# Patient Record
Sex: Female | Born: 1970 | Race: Black or African American | Hispanic: No | Marital: Married | Smoking: Never smoker
Health system: Southern US, Community
[De-identification: ages and names within clinical notes are randomized; demographics above are authoritative.]

## PROBLEM LIST (undated history)

## (undated) DIAGNOSIS — I10 Essential (primary) hypertension: Secondary | ICD-10-CM

## (undated) DIAGNOSIS — J45909 Unspecified asthma, uncomplicated: Secondary | ICD-10-CM

## (undated) DIAGNOSIS — R569 Unspecified convulsions: Secondary | ICD-10-CM

## (undated) HISTORY — DX: Essential (primary) hypertension: I10

## (undated) HISTORY — DX: Unspecified asthma, uncomplicated: J45.909

## (undated) HISTORY — PX: EYE SURGERY: SHX253

## (undated) HISTORY — DX: Unspecified convulsions: R56.9

## (undated) HISTORY — PX: OTHER SURGICAL HISTORY: SHX169

## (undated) HISTORY — PX: TUBAL LIGATION: SHX77

## (undated) HISTORY — PX: CHOLECYSTECTOMY: SHX55

---
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
## 1998-02-02 ENCOUNTER — Other Ambulatory Visit: Admission: RE | Admit: 1998-02-02 | Discharge: 1998-02-02 | Payer: Self-pay | Admitting: Obstetrics and Gynecology

## 1999-03-04 ENCOUNTER — Other Ambulatory Visit: Admission: RE | Admit: 1999-03-04 | Discharge: 1999-03-04 | Payer: Self-pay | Admitting: Obstetrics and Gynecology

## 2000-03-01 ENCOUNTER — Other Ambulatory Visit: Admission: RE | Admit: 2000-03-01 | Discharge: 2000-03-01 | Payer: Self-pay | Admitting: Obstetrics and Gynecology

## 2000-04-17 ENCOUNTER — Encounter (INDEPENDENT_AMBULATORY_CARE_PROVIDER_SITE_OTHER): Payer: Self-pay | Admitting: Specialist

## 2000-04-17 ENCOUNTER — Other Ambulatory Visit: Admission: RE | Admit: 2000-04-17 | Discharge: 2000-04-17 | Payer: Self-pay | Admitting: Obstetrics and Gynecology

## 2001-04-25 ENCOUNTER — Other Ambulatory Visit: Admission: RE | Admit: 2001-04-25 | Discharge: 2001-04-25 | Payer: Self-pay | Admitting: Obstetrics and Gynecology

## 2002-03-13 ENCOUNTER — Emergency Department (HOSPITAL_COMMUNITY): Admission: EM | Admit: 2002-03-13 | Discharge: 2002-03-14 | Payer: Self-pay | Admitting: Emergency Medicine

## 2002-06-29 ENCOUNTER — Inpatient Hospital Stay (HOSPITAL_COMMUNITY): Admission: AD | Admit: 2002-06-29 | Discharge: 2002-07-10 | Payer: Self-pay | Admitting: Obstetrics and Gynecology

## 2002-06-30 ENCOUNTER — Encounter: Payer: Self-pay | Admitting: Obstetrics and Gynecology

## 2002-07-05 ENCOUNTER — Encounter: Payer: Self-pay | Admitting: Obstetrics and Gynecology

## 2002-07-09 ENCOUNTER — Encounter (INDEPENDENT_AMBULATORY_CARE_PROVIDER_SITE_OTHER): Payer: Self-pay | Admitting: Specialist

## 2002-09-05 ENCOUNTER — Emergency Department (HOSPITAL_COMMUNITY): Admission: AD | Admit: 2002-09-05 | Discharge: 2002-09-05 | Payer: Self-pay | Admitting: Emergency Medicine

## 2002-11-28 ENCOUNTER — Other Ambulatory Visit: Admission: RE | Admit: 2002-11-28 | Discharge: 2002-11-28 | Payer: Self-pay | Admitting: Obstetrics and Gynecology

## 2002-11-29 ENCOUNTER — Other Ambulatory Visit: Admission: RE | Admit: 2002-11-29 | Discharge: 2002-11-29 | Payer: Self-pay | Admitting: Obstetrics and Gynecology

## 2002-12-02 ENCOUNTER — Encounter: Payer: Self-pay | Admitting: Obstetrics and Gynecology

## 2002-12-02 ENCOUNTER — Ambulatory Visit (HOSPITAL_COMMUNITY): Admission: RE | Admit: 2002-12-02 | Discharge: 2002-12-02 | Payer: Self-pay | Admitting: Obstetrics and Gynecology

## 2003-02-03 ENCOUNTER — Observation Stay (HOSPITAL_COMMUNITY): Admission: RE | Admit: 2003-02-03 | Discharge: 2003-02-04 | Payer: Self-pay | Admitting: Obstetrics and Gynecology

## 2003-03-05 ENCOUNTER — Ambulatory Visit (HOSPITAL_COMMUNITY): Admission: RE | Admit: 2003-03-05 | Discharge: 2003-03-05 | Payer: Self-pay | Admitting: Obstetrics and Gynecology

## 2003-03-25 ENCOUNTER — Inpatient Hospital Stay (HOSPITAL_COMMUNITY): Admission: AD | Admit: 2003-03-25 | Discharge: 2003-03-28 | Payer: Self-pay | Admitting: Obstetrics and Gynecology

## 2003-03-25 ENCOUNTER — Encounter (INDEPENDENT_AMBULATORY_CARE_PROVIDER_SITE_OTHER): Payer: Self-pay

## 2003-04-05 DIAGNOSIS — I10 Essential (primary) hypertension: Secondary | ICD-10-CM

## 2003-04-05 HISTORY — DX: Essential (primary) hypertension: I10

## 2003-09-14 ENCOUNTER — Emergency Department (HOSPITAL_COMMUNITY): Admission: AC | Admit: 2003-09-14 | Discharge: 2003-09-14 | Payer: Self-pay | Admitting: Emergency Medicine

## 2003-09-23 ENCOUNTER — Ambulatory Visit (HOSPITAL_COMMUNITY): Admission: RE | Admit: 2003-09-23 | Discharge: 2003-09-23 | Payer: Self-pay | Admitting: General Surgery

## 2004-01-20 ENCOUNTER — Ambulatory Visit: Payer: Self-pay | Admitting: *Deleted

## 2004-01-26 ENCOUNTER — Inpatient Hospital Stay (HOSPITAL_COMMUNITY): Admission: AD | Admit: 2004-01-26 | Discharge: 2004-01-28 | Payer: Self-pay | Admitting: *Deleted

## 2004-02-04 ENCOUNTER — Ambulatory Visit: Payer: Self-pay | Admitting: *Deleted

## 2004-02-11 ENCOUNTER — Ambulatory Visit: Payer: Self-pay | Admitting: *Deleted

## 2004-02-18 ENCOUNTER — Ambulatory Visit: Payer: Self-pay | Admitting: Obstetrics & Gynecology

## 2004-02-20 ENCOUNTER — Ambulatory Visit (HOSPITAL_COMMUNITY): Admission: RE | Admit: 2004-02-20 | Discharge: 2004-02-20 | Payer: Self-pay | Admitting: *Deleted

## 2004-03-03 ENCOUNTER — Ambulatory Visit: Payer: Self-pay | Admitting: *Deleted

## 2004-03-17 ENCOUNTER — Ambulatory Visit (HOSPITAL_COMMUNITY): Admission: RE | Admit: 2004-03-17 | Discharge: 2004-03-17 | Payer: Self-pay | Admitting: *Deleted

## 2004-03-17 ENCOUNTER — Ambulatory Visit: Payer: Self-pay | Admitting: *Deleted

## 2004-03-24 ENCOUNTER — Ambulatory Visit: Payer: Self-pay | Admitting: *Deleted

## 2004-04-07 ENCOUNTER — Ambulatory Visit: Payer: Self-pay | Admitting: *Deleted

## 2004-04-09 ENCOUNTER — Inpatient Hospital Stay (HOSPITAL_COMMUNITY): Admission: AD | Admit: 2004-04-09 | Discharge: 2004-04-09 | Payer: Self-pay | Admitting: *Deleted

## 2004-04-14 ENCOUNTER — Ambulatory Visit: Payer: Self-pay | Admitting: *Deleted

## 2004-04-14 ENCOUNTER — Ambulatory Visit: Payer: Self-pay | Admitting: Obstetrics and Gynecology

## 2004-04-14 ENCOUNTER — Inpatient Hospital Stay (HOSPITAL_COMMUNITY): Admission: AD | Admit: 2004-04-14 | Discharge: 2004-04-18 | Payer: Self-pay | Admitting: Obstetrics and Gynecology

## 2004-04-21 ENCOUNTER — Inpatient Hospital Stay (HOSPITAL_COMMUNITY): Admission: AD | Admit: 2004-04-21 | Discharge: 2004-04-30 | Payer: Self-pay | Admitting: *Deleted

## 2004-04-21 ENCOUNTER — Ambulatory Visit: Payer: Self-pay | Admitting: *Deleted

## 2004-04-21 ENCOUNTER — Ambulatory Visit: Payer: Self-pay | Admitting: Obstetrics & Gynecology

## 2004-04-29 ENCOUNTER — Encounter (INDEPENDENT_AMBULATORY_CARE_PROVIDER_SITE_OTHER): Payer: Self-pay | Admitting: Specialist

## 2004-05-12 ENCOUNTER — Emergency Department (HOSPITAL_COMMUNITY): Admission: EM | Admit: 2004-05-12 | Discharge: 2004-05-12 | Payer: Self-pay | Admitting: Family Medicine

## 2004-06-17 ENCOUNTER — Ambulatory Visit: Payer: Self-pay | Admitting: Obstetrics and Gynecology

## 2004-07-03 ENCOUNTER — Encounter (INDEPENDENT_AMBULATORY_CARE_PROVIDER_SITE_OTHER): Payer: Self-pay | Admitting: *Deleted

## 2004-07-03 LAB — CONVERTED CEMR LAB

## 2004-07-12 ENCOUNTER — Ambulatory Visit: Payer: Self-pay | Admitting: Family Medicine

## 2004-07-12 ENCOUNTER — Ambulatory Visit (HOSPITAL_COMMUNITY): Admission: RE | Admit: 2004-07-12 | Discharge: 2004-07-12 | Payer: Self-pay | Admitting: Family Medicine

## 2004-07-27 ENCOUNTER — Ambulatory Visit: Payer: Self-pay | Admitting: Obstetrics & Gynecology

## 2004-08-12 ENCOUNTER — Ambulatory Visit: Payer: Self-pay | Admitting: Family Medicine

## 2004-10-01 ENCOUNTER — Ambulatory Visit: Payer: Self-pay | Admitting: Family Medicine

## 2004-11-12 ENCOUNTER — Emergency Department (HOSPITAL_COMMUNITY): Admission: EM | Admit: 2004-11-12 | Discharge: 2004-11-12 | Payer: Self-pay | Admitting: Family Medicine

## 2006-06-01 DIAGNOSIS — M545 Low back pain: Secondary | ICD-10-CM

## 2006-06-02 ENCOUNTER — Encounter (INDEPENDENT_AMBULATORY_CARE_PROVIDER_SITE_OTHER): Payer: Self-pay | Admitting: *Deleted

## 2006-09-22 ENCOUNTER — Ambulatory Visit (HOSPITAL_BASED_OUTPATIENT_CLINIC_OR_DEPARTMENT_OTHER): Admission: RE | Admit: 2006-09-22 | Discharge: 2006-09-22 | Payer: Self-pay | Admitting: Ophthalmology

## 2009-11-17 ENCOUNTER — Emergency Department (HOSPITAL_COMMUNITY): Admission: EM | Admit: 2009-11-17 | Discharge: 2009-11-17 | Payer: Self-pay | Admitting: Family Medicine

## 2010-08-17 NOTE — Op Note (Signed)
[name], [name]              ACCOUNT NO.:  [id number]   MEDICAL RECORD NO.:  [id number]          PATIENT TYPE:  AMB   LOCATION:  DSC                          FACILITY:  [facility]   PHYSICIAN:  Pasty Spillers. Maple Hudson, M.D. DATE OF BIRTH:  05-13-1970   DATE OF PROCEDURE:  09/22/2006  DATE OF DISCHARGE:                               OPERATIVE REPORT   PREOPERATIVE DIAGNOSES:  1. A pattern exotropia.  2. Status post previous surgery, details unknown.  3. Developmental delay.   POSTOPERATIVE DIAGNOSES:  1. A pattern exotropia.  2. Status post previous surgery, details unknown.  3. Developmental delay.   PROCEDURES:  1. Exploration of left medial and lateral rectus muscles and right      lateral rectus muscle.  2. Left lateral rectus muscle recession, 9.0 mm.  3. Left medial rectus muscle resection, 7.0 mm.  4. Right lateral rectus muscle recession, 9.0 mm (appeared previously      resected).  5. Superior oblique tenotomy, both eyes.   SURGEON:  Pasty Spillers. Maple Hudson, M.D.   ANESTHESIA:  General (laryngeal mask).   COMPLICATIONS:  None.   DESCRIPTION OF PROCEDURE:  After preop evaluation including informed  consent, the patient was taken to the operating room, where she was  identified by me.  General anesthesia was induced without difficulty  after placement of appropriate monitors.  The patient was prepped and  draped in a standard sterile fashion.  A lid speculum placed in each  eye, and exaggerated forced traction testing was carried out, confirming  tightness of both superior oblique tendons.  Through a superonasal nasal  fornix incision through conjunctiva and Tenon's fascia, the left  superior rectus muscle was engaged on a series of muscle hooks.  A  Desmarres retractor was placed through the conjunctival incision and  drawn posteriorly, exposing the superior oblique tendon along the nasal  border of the superior rectus muscle.  The tendon was engaged on an  oblique hook and  severed with Westcott scissors.  Forced traction  testing was repeated and confirmed to be free.  Through an  inferotemporal fornix incision, the left lateral rectus muscle was  engaged on a series of muscle hooks and cleared of its fascial  attachments.  There was no evidence of previous surgery of lateral  rectus muscle.  The tendon was secured with a double-armed 6-0 Vicryl  suture, with a double-locking bite at each border of the muscle, 1 mm  from the insertion.  The muscle was disinserted but not reattached,  pending evaluation of other muscles.  Through the same superonasal  fornix incision used for the superior oblique tenotomy, the right medial  rectus muscle was engaged on a series of muscle hooks and cleared of its  fascial attachments.  Note that the muscle did not appear to be  previously operated.  The muscle was spread between two self-retaining  hooks.  A 2 mm bite was taken of the center of the muscle belly at a  measured distance of 7.0 mm posterior to the original insertion, and a  knot was tied securely at this location.  The needle at each end of the  double-armed suture was passed from the center of the muscle belly to  the periphery, parallel to and 7.0 mm posterior to the insertion.  A  double-locking bite was placed at each border of the muscle.  A  resection clamp was placed on the muscle just anterior to these sutures.  The muscle was disinserted.  Each pole suture was passed posteriorly to  anteriorly through the periphery of the muscle stump, then anteriorly to  posteriorly near the center of the stump, then posteriorly to anteriorly  through the center of muscle belly, just posterior to the previously-  placed knot.  The muscle was drawn up to the level of the original  insertion and all slack was removed before the suture ends were tied  securely.  The clamp was removed.  The portion of the muscle anterior to  the sutures was carefully excised.  Conjunctiva  was closed with two 6-0  Vicryl sutures.  The speculum was removed and transferred to the right  eye.  Conjunctival scarring confirmed that there had been previous  surgery in the vicinity of the right lateral rectus muscle.  Through an  inferotemporal fornix incision through conjunctiva and Tenon's fascia,  the right lateral rectus muscle was engaged on a series of muscle hooks  and cleared of its surrounding fascial attachments and scar tissue.  The  muscle was found inserted approximately 7 mm posterior to limbus, but it  had clearly been operated, so it was concluded that it must have been  resected previously.  The tendon was secured with a double-armed 6-0  Vicryl suture, with a double-locking bite at each border of the muscle,  1 mm from the insertion.  The muscle was disinserted..  The insertion  was not clean, again consistent with a previous resection.  The muscle  was reattached to sclera at a measured distance of 9.0 mm posterior to  the original insertion, using direct scleral passes in crossed-swords  fashion.  The suture ends were tied securely after the position of the  muscle was checked and found to be accurate.  The conjunctiva was closed  with two 6-0 Vicryl sutures.  Through a superonasal nasal fornix  incision through conjunctiva and Tenon's fascia, the right superior  oblique tendon was severed, just as described for the left eye.  Again,  forced traction testing was confirmed to be free after the tenotomy.  After closing conjunctiva, TobraDex ointment was placed in each eye.  The patient was awakened without difficulty and taken to the recovery  room in stable condition, having suffered no intraoperative or immediate  postoperative complications.      Pasty Spillers. Maple Hudson, M.D.  Electronically Signed     WOY/MEDQ  D:  09/22/2006  T:  09/22/2006  Job:  [id number]

## 2010-08-20 NOTE — Discharge Summary (Signed)
   [name], [name]                        ACCOUNT NO.:  [id number]   MEDICAL RECORD NO.:  [id number]                   PATIENT TYPE:  INP   LOCATION:  9129                                 [name]   PHYSICIAN:  Gerrit Friends. Aldona Bar, M.D.                DATE OF BIRTH:  1970/07/10   DATE OF ADMISSION:  06/29/2002  DATE OF DISCHARGE:  07/10/2002                                 DISCHARGE SUMMARY   DISCHARGE DIAGNOSES:  1. Twenty-one week intrauterine pregnancy, delivered a 13 ounce nonviable     female infant.  2. Blood type B positive.  3. Positive group B strep.  4. Preterm premature rupture of membranes.  5. Retained placenta.   PROCEDURES:  1. Normal spontaneous delivery.  2. D&C for retained placenta.   SUMMARY:  This 40 year old gravida 2 para 1 presented on June 29, 2002 with  preterm premature rupture of membranes.  In the office earlier she was noted  to have a dilated cervix with funneling and was scheduled for a cerclage on  July 01, 2002.  A group B strep culture done earlier in pregnancy was  positive, as well as a culture done several days before admission.  She was  admitted with obvious preterm premature rupture of membranes.  She was begun  on intravenous Unasyn, placed at bedrest, and essentially remained stable  with no fever and a white count that was acceptable for a total of 10 days.  Her Unasyn was changed to Augmentin orally.  Unfortunately, on April 6 she  began having some bleeding associated with cramping and ultimately delivered  a viable female infant weighing 13 ounces from a footling breech  presentation.  Because of difficulty delivering the placenta, a D&C was  required and was also carried out on July 09, 2002.  On the morning of April  7 she was afebrile, she was ambulating well, tolerating a regular diet well,  was having minimal bleeding, minimal discomfort, and was ready for  discharge.  Accordingly she was given all appropriate  instructions.  She  will return to the office in approximately two to three weeks time for  follow-up.   DISCHARGE MEDICATIONS:  1. Ferrous sulfate 300 mg daily.  2. Motrin 600 mg q.6h. as needed for pain.   LABORATORY DATA:  A discharge hemoglobin on the morning of April 7 was 9.8  with a white count of 20,400 and a platelet count of 229,000.   CONDITION ON DISCHARGE:  Improved.                                               Gerrit Friends. Aldona Bar, M.D.    RMW/MEDQ  D:  07/10/2002  T:  07/10/2002  Job:  [id number]

## 2010-08-20 NOTE — Discharge Summary (Signed)
[name], [name]                        ACCOUNT NO.:  [id number]   MEDICAL RECORD NO.:  [id number]                   PATIENT TYPE:  INP   LOCATION:  9138                                 [facility]   PHYSICIAN:  Miguel Aschoff, M.D.                    DATE OF BIRTH:  27-Jun-1970   DATE OF ADMISSION:  03/25/2003  DATE OF DISCHARGE:  03/28/2003                                 DISCHARGE SUMMARY   ADMISSION DIAGNOSES:  1. Intrauterine pregnancy at 22 weeks.  2. Chronic hypertension with superimposed preeclampsia.   FINAL DIAGNOSES:  1. Intrauterine pregnancy at 22 weeks.  2. Chronic hypertension with superimposed preeclampsia.  3. Delivery of preterm infant at 23 weeks.   BRIEF HISTORY:  The patient is a 40 year old black female admitted on  March 25, 2003, at 22 weeks 2 days. The patient was noted to be  hypertensive and had proteinuria. She was admitted for evaluation, started  on bedrest, and then labetalol. Being treated, there was spontaneous rupture  of her membranes on March 27, 2003. This occurred in spite of the  placement of a cervical cerclage. The sutures about the cervix were removed  and the patient quickly delivered. Following delivery she was continued on  labetalol, her blood pressure ran in the range of 145/96. She was able to be  discharged home on March 28, 2003.  At that time her hemoglobin was 11,  white count 12,100, platelet count 243,000. Blood pressure was 145/96. She  was instructed in no heavy lifting, to place nothing in the vagina. She is  to call if any problems such as fever, pain, or heavy bleeding. She is to be  sent home with Darvocet N-100 one q.4h. p.r.n. pain. The placenta was sent  for histologic study and revealed the presence of acute chorioamnionitis.  The event was preterm and because of extreme prematurity was nonviable, did  not survive, and __________ family.                                               Miguel Aschoff,  M.D.    AR/MEDQ  D:  05/22/2003  T:  05/22/2003  Job:  [id number]

## 2010-08-20 NOTE — Discharge Summary (Signed)
NAMEDANNI, SHIMA                        ACCOUNT NO.:  [id number]   MEDICAL RECORD NO.:  [id number]                   PATIENT TYPE:  INP   LOCATION:  9138                                 [facility]   PHYSICIAN:  Miguel Aschoff, M.D.                    DATE OF BIRTH:  [birth year]   DATE OF ADMISSION:  03/25/2003  DATE OF DISCHARGE:  03/28/2003                                 DISCHARGE SUMMARY   Dictation canceled. Will redictate.     Leilani Able, P.A.-C.                Miguel Aschoff, M.D.    MB/MEDQ  D:  05/02/2003  T:  05/03/2003  Job:  [id number]

## 2010-08-20 NOTE — Op Note (Signed)
[name], [name]              ACCOUNT NO.:  [id number]   MEDICAL RECORD NO.:  [id number]          PATIENT TYPE:  AMB   LOCATION:  SDC                           [facility]   PHYSICIAN:  Tanya S. Shawnie Pons, M.D.   DATE OF BIRTH:  1970/12/15   DATE OF PROCEDURE:  07/12/2004  DATE OF DISCHARGE:                                 OPERATIVE REPORT   PREOPERATIVE DIAGNOSIS:  Undesired fertility and multiparity.   POSTOPERATIVE DIAGNOSIS:  Undesired fertility and multiparity.   PROCEDURE:  Laparoscopic bilateral tubal ligation.   SURGEON:  Shelbie Proctor. Shawnie Pons, M.D.   ASSISTANT:  None.   ANESTHESIA:  General and local.   SPECIMENS:  None.   ESTIMATED BLOOD LOSS:  Not applicable.   COMPLICATIONS:  None.   REASON FOR PROCEDURE:  Briefly, the patient is a 40 year old gravida 4, para  0-4-0-2, who had an incompetent cervix with a very poor OB history.  The  patient desires permanent sterility.  The patient was counseled over the  risks and benefits of the procedure, including risk of permanency, risk of  failure of one in 200, increased risk of ectopic.  The patient understood  these risks and agreed to proceed.   PROCEDURE:  The patient was taken to the OR, where she was placed in dorsal  lithotomy in [place] stirrups.  When anesthesia was adequate, she was then  prepped and draped in the usual sterile fashion.  A red rubber catheter was  used to drain the bladder of clear yellow urine and the speculum was then  placed inside the vagina, cervix visualized.  It was located anteriorly.  The anterior lip was grasped with a single-tooth tenaculum and the Hulka  tenaculum placed into the retroverted uterus.  It was clasped to the  posterior lip of the cervix.  Attention was then turned to the abdomen.  The  umbilicus was cleaned with more Betadine, the edges were grasped with Allis  clamps, and a knife was used to make an incision down through the skin.  This incision was carried down to the  underlying fascia and the peritoneal  cavity entered sharply.  Both sides of the fascia were then tagged with a 0  Vicryl suture on a UR6, a Hasson trocar placed through this incision.  CO2  was turned on pneumoperitoneum created, the operative scope then passed  through the trocar.  The patient was placed in Trendelenburg, the uterus was  elevated, and the tubes were visualized.  A Filshie clip applier was then  used to make sure that there were fimbriae on the ends of both tubes before  Filshie clips were placed across the tubes bilaterally approximately 1.5 cm  from the cornu.  Pictures of this were taken.  All instruments were then  removed from the abdomen, the pneumoperitoneum deflated, the fascia  closed using the two 0 Vicryl sutures and two figure-of-eights.  The skin  was closed with a 4-0 Vicryl suture in a running subcuticular fashion.  All  instrument, needle and lap counts were correct x2.  The patient was awakened  and taken  to the recovery room in stable condition.      TSP/MEDQ  D:  07/12/2004  T:  07/12/2004  Job:  [id number]

## 2010-08-20 NOTE — Discharge Summary (Signed)
[name], [name]              ACCOUNT NO.:  [id number]   MEDICAL RECORD NO.:  [id number]          PATIENT TYPE:  INP   LOCATION:  9305                          [facility]ILITY[facility]   PHYSICIAN:  Conni Elliot, M.D.DATE OF BIRTH:  11-Aug-1970   DATE OF ADMISSION:  01/26/2004  DATE OF DISCHARGE:                                 DISCHARGE SUMMARY   DISCHARGE DIAGNOSIS:  Intrauterine pregnancy at 10 weeks postoperative day  #1 from cerclage placement.   DISCHARGE MEDICATIONS:  1.  Prenatal vitamin one tablet p.o. daily.  2.  Amoxicillin 500 mg one tablet p.o. q.h.s. x7 days.   FOLLOW-UP:  [facility], [facility] at [facility] at 9:30 a.m.   PROCEDURES AND DIAGNOSTIC STUDIES:  Cerclage placement on January 27, 2004.   ADMISSION HISTORY AND PHYSICAL:  The patient is a 40 year old G4 P0-1-2-0-1  with a history of two fetal losses and a history of cerclage failure who  presented for placement of cerclage.   HOSPITAL COURSE:  During this hospitalization, the patient was covered with  Unasyn due to her GBS status.  The patient was medicated with Indocin to  prevent preterm labor.  Additionally, cerclage placement was successfully  completed on January 27, 2004 by Dr. Gavin Potters.  Labs from this  hospitalization include a CBC on January 26, 2004 notable for a hemoglobin  of 13.4, hematocrit of 39.0, and platelet count of 322.  At time of  discharge, the patient was stable on bedrest with bathroom privileges.  The  patient was also dosed with Delalutin 250 mg IM x1 before discharge.      GSD/MEDQ  D:  01/28/2004  T:  01/28/2004  Job:  [id number]

## 2010-08-20 NOTE — Op Note (Signed)
   NAMEFAUSTINE, [name]                        ACCOUNT NO.:  [id number]   MEDICAL RECORD NO.:  [id number]                   PATIENT TYPE:  AMB   LOCATION:  SDC                                  [facility]   PHYSICIAN:  Malva Limes, M.D.                 DATE OF BIRTH:  12-12-1970   DATE OF PROCEDURE:  02/03/2003  DATE OF DISCHARGE:                                 OPERATIVE REPORT   PREOPERATIVE DIAGNOSES:  1. Intrauterine pregnancy at 14-1/2 weeks' estimated gestational age.  2. Incompetent cervix.   POSTOPERATIVE DIAGNOSES:  1. Intrauterine pregnancy at 14-1/2 weeks' estimated gestational age.  2. Incompetent cervix.   PROCEDURE:  McDonald cerclage.   SURGEON:  Malva Limes, M.D.   ANESTHESIA:  Spinal.   ANTIBIOTICS:  Ancef 1 g and Cleocin douche to the vagina.   DRAINS:  Foley to bedside drain, red rubber catheter to bladder.   COMPLICATIONS:  None.   ESTIMATED BLOOD LOSS:  Minimal.   SPECIMENS:  None.   DESCRIPTION OF PROCEDURE:  The patient was taken to the operating room,  where a spinal anesthetic was administered without complication.  She was  then placed in the dorsal lithotomy position and prepped with Hibiclens.  Her bladder was drained with a red rubber catheter.  She was then draped in  the usual fashion for this procedure.  A weighted speculum was placed in the  vagina.  Prolene 0 was used for McDonald cerclage.  This was placed without  difficulty.  Once suture was placed.  It was noted that the lower uterine  segment was already bulging.  The cervix was fingertip dilated.  Once the  McDonald cerclage was placed, this reduced some of the bulging of the lower  uterine segment.  The patient then had Cleocin 150 mg douche placed in the  vagina and held in place with two sponges.  This concluded the procedure.  The patient was taken to the recovery room in stable condition.  She will be  observed for 24 hours, fetal heart tones will be reassessed in  the recovery  room.                                               Malva Limes, M.D.    MA/MEDQ  D:  02/03/2003  T:  02/03/2003  Job:  [id number]

## 2010-08-20 NOTE — Op Note (Signed)
   [name], [name]                        ACCOUNT NO.:  [id number]   MEDICAL RECORD NO.:  [id number]                   PATIENT TYPE:  INP   LOCATION:  9129                                 [facility]ILITY[facility]   PHYSICIAN:  Malva Limes, M.D.                 DATE OF BIRTH:  1970/04/24   DATE OF PROCEDURE:  07/09/2002  DATE OF DISCHARGE:                                 OPERATIVE REPORT   PREOPERATIVE DIAGNOSIS:  Retained placenta.   POSTOPERATIVE DIAGNOSIS:  Retained placenta.   PROCEDURE:  Dilation and evacuation.   SURGEON:  Malva Limes, M.D.   ANESTHESIA:  General.   ANTIBIOTICS:  Ancef 1 g.   ESTIMATED BLOOD LOSS:  100 mL.   SPECIMENS:  Placenta sent to pathology.   COMPLICATIONS:  None.   DRAINS:  None.   DESCRIPTION OF PROCEDURE:  The patient was taken to the operating room and  placed in the dorsal supine position.  A general anesthetic was administered  without complications.  She was then placed in the dorsal lithotomy  position.  She was prepped with Hibiclens and draped with green towels.  A  sterile speculum was placed in the vagina.  The cervix was already dilated.  A single-tooth tenaculum was applied to the anterior cervical lip.  The  placenta was removed with a ring forceps.  Following that, sharp curettage  was performed, followed by suction curettage.  The patient tolerated the  procedure well.  There was no evidence of any retained products.  Bimanual  exam was done at the conclusion of the procedure.  The patient was taken to  the recovery room in stable condition.  Instrument and lap counts were  correct x1.                                               Malva Limes, M.D.    MA/MEDQ  D:  07/09/2002  T:  07/10/2002  Job:  [id number]

## 2010-08-20 NOTE — Discharge Summary (Signed)
NAMEMARAJADE, [name]              ACCOUNT NO.:  [id number]   MEDICAL RECORD NO.:  [id number]          PATIENT TYPE:  WOC   LOCATION:  WOC                          FACILITY:  [facility]   PHYSICIAN:  Phil D. Okey Dupre, M.D.     DATE OF BIRTH:  01-22-1971   DATE OF ADMISSION:  04/14/2004  DATE OF DISCHARGE:  04/18/2004                                 DISCHARGE SUMMARY   ADMISSION DIAGNOSES:  65.  A 40 year old G4, P0-3-0-1, at 22 weeks with suspected cervicitis.  2.  Positive fetal heart rate by Doppler.  3.  Chronic hypertension.  4.  Cervical incompetence, status post cerclage.   DISCHARGE DIAGNOSES:  64.  A 40 year old G4, P0-3-0-1, at 33 and 4, status post 5 day course of      Unasyn.  2.  Positive fetal heart rate by Doppler.  3.  Chronic hypertension.  4.  Cervical incompetence, status post cerclage.   DISCHARGE MEDICATIONS:  1.  Aldomet 250 mg p.o. b.i.d.  2.  Prenatal vitamins 1 p.o. daily.   ADMISSION HISTORY:  [name] with an  unusual cervical exam of significant edema and spotting.  She is admitted  for IV antibiotics with a history of positive GBS.   HOSPITAL COURSE:  [name] remained stable on bed rest throughout her  hospitalization.  She received a 5 day course of Unasyn and had no  complications.  She had a positive fetal heart rate throughout her  hospitalization.   Chronic hypertension.  Her blood pressure remained stable on Aldomet.  Her  PIH labs were within normal limits except for her 24 hour urine which was  significant for 470 mg of protein.  This is likely due to her chronic  hypertension.   CONDITION ON DISCHARGE:  The patient was discharged to home in stable  condition.   INSTRUCTIONS GIVEN TO PATIENT:  The patient was told to continue her medical  regimen.  [facility] will [facility] with [facility] days from discharge.     LC/MEDQ  D:  04/18/2004  T:  04/18/2004  Job:  [id number]   cc:   [facility]

## 2010-08-20 NOTE — Op Note (Signed)
[name], [name]              ACCOUNT NO.:  [id number]   MEDICAL RECORD NO.:  [id number]          PATIENT TYPE:  INP   LOCATION:  9305                          [facility]   PHYSICIAN:  Conni Elliot, M.D.DATE OF BIRTH:  February 24, 1971   DATE OF PROCEDURE:  01/27/2004  DATE OF DISCHARGE:                                 OPERATIVE REPORT   PREOPERATIVE DIAGNOSES:  Decreased cervical resistance and positive group B  strep and history of prior failed cerclage.   POSTOPERATIVE DIAGNOSES:  Decreased cervical resistance and positive group B  strep and history of prior failed cerclage.   OPERATION:  McDonald cerclage.   SURGEON:  Conni Elliot, M.D.   ANESTHESIA:  Spinal.   DESCRIPTION OF PROCEDURE:  After placing the patient under spinal, the  patient placed in the dorsal supine position, perineum and vagina prepped  with __________ solution, clindamycin douche was provided.  Foley catheter  placed for straight drainage. A weighted speculum was placed in the  posterior vagina. The anterior cervix grasped with a sponge stick and a  cervical cerclage using 4-0 silk double stranded was placed starting at 12  o'clock and going counter clockwise, tied at 12 o'clock.  Estimated blood  loss less than 50 mL.      ASG/MEDQ  D:  01/27/2004  T:  01/27/2004  Job:  [id number]

## 2010-08-20 NOTE — Discharge Summary (Signed)
[name], [name]              ACCOUNT NO.:  [id number]   MEDICAL RECORD NO.:  [id number]          PATIENT TYPE:  WOC   LOCATION:  WOC                          FACILITY:  [facility]   PHYSICIAN:  Conni Elliot, M.D.DATE OF BIRTH:  Apr 04, 1971   DATE OF ADMISSION:  04/21/2004  DATE OF DISCHARGE:  04/30/2004                                 DISCHARGE SUMMARY   DIAGNOSES:  67.  A 40 year old G4 P0-3-0-1 at 28 and 1 weeks, with cervical dilation.  2.  History of incompetent cervix, status post cerclage.  3.  Chronic hypertension.   DISCHARGE DIAGNOSES:  11.  A 40 year old G4 P0-4-0-2, postpartum day one, status post vaginal      delivery of a 76 and 2 week boy.  2.  Viable infant, female, with Apgars 2 and 7, in the neonatal intensive care      unit.  3.  Controlled blood pressure.   DISCHARGE MEDICATIONS:  1.  Ibuprofen 600 mg one p.o. q.6 h. p.r.n.  2.  Prenatal vitamins 1 p.o. daily.   ADMISSION HISTORY:  Ms. [name] was admitted from high risk clinic with  cervical dilation to 4 cm and a bulging bag of water.  She was admitted and  placed on magnesium, Indocin, and placed in Trendelenburg on strict bed  rest.   HOSPITAL COURSE:  She remained stable on bed rest.  At 23 and 5 weeks, she  did have a run of contractions, and her magnesium was increased to 3 g/hour.  She was also started on Procardia 10 mg q.6 h.  This quieted her uterus  down.  At that time, she was given her first dose of betamethasone, which  was repeated at 23 and 6 weeks.  At 24 weeks, her magnesium was decrease to  2/hour, since she was asymptomatic.  At 24 and 1, her magnesium was  discontinued, and she was continued on Procardia XL 30 b.i.d.   At 24 and 2 weeks, she began having intermittent contractions which then  increased.  She then began having some spotting which then progressed to  frank bleeding.  At that point, she was examined, and her cervix was noted  to be 6-7 cm, with a bulging bag.  It was  decided that she was tearing  through her cerclage, and that was removed.  On removal, her bag of water  was ruptured unintentionally but inevitably.  She then delivered the 24 and  2 week infant.  The baby was intubated and taken to the NICU, where he is  stable on the patient's day of discharge.  It was decided that the patient  likely had a placental abruption, because her bleeding ceased after the  delivery of the infant.   On postpartum day one, the patient decided she wanted to go home early.  She  does desire tubal ligation.  She will be followed in the gyn clinic for a  preoperative appointment and then have a tubal ligation in six weeks.  Her  postpartum hemoglobin was 12, and she was discharged to home in stable  condition.   INSTRUCTIONS  GIVEN TO PATIENT:  The patient was told of the above medical  regimen and also of her appointment date in February 2006 for tubal  preoperative visit.      LC/MEDQ  D:  04/30/2004  T:  04/30/2004  Job:  [id number]   cc:   [facility]

## 2011-01-19 LAB — POCT HEMOGLOBIN-HEMACUE
Hemoglobin: 11.3 — ABNORMAL LOW
Operator id: 116011

## 2011-01-19 LAB — BASIC METABOLIC PANEL
BUN: 13
CO2: 27
Calcium: 8.9
Chloride: 105
Creatinine, Ser: 0.9
GFR calc Af Amer: >60
GFR calc non Af Amer: >60
Glucose, Bld: 98
Potassium: 4.2
Sodium: 138

## 2012-05-05 ENCOUNTER — Encounter (HOSPITAL_COMMUNITY): Payer: Self-pay | Admitting: Emergency Medicine

## 2012-05-05 ENCOUNTER — Emergency Department (HOSPITAL_COMMUNITY)
Admission: EM | Admit: 2012-05-05 | Discharge: 2012-05-05 | Disposition: A | Discharge status: Home / Self Care | Payer: Medicaid Other | Source: Home / Self Care | Attending: Emergency Medicine | Admitting: Emergency Medicine

## 2012-05-05 DIAGNOSIS — L309 Dermatitis, unspecified: Secondary | ICD-10-CM

## 2012-05-05 DIAGNOSIS — L259 Unspecified contact dermatitis, unspecified cause: Secondary | ICD-10-CM

## 2012-05-05 MED ORDER — PREDNISONE 20 MG PO TABS: 20.0000 mg | ORAL_TABLET | Freq: Two times a day (BID) | ORAL | Status: DC

## 2012-05-05 MED ORDER — HYDROCORTISONE 1 % EX CREA: TOPICAL_CREAM | CUTANEOUS | Status: DC

## 2012-05-05 MED ORDER — HYDROXYZINE HCL 25 MG PO TABS: ORAL_TABLET | ORAL | Status: DC

## 2012-05-05 MED ORDER — TRIAMCINOLONE ACETONIDE 0.1 % EX CREA: TOPICAL_CREAM | CUTANEOUS | Status: DC

## 2012-05-05 NOTE — ED Provider Notes (Signed)
Chief Complaint  Patient presents with  . Rash    History of Present Illness:   [name] is a 42 year old female who has had a 2 to three-month history of a rash around both eyes, left more so than right and on her left forearm. The rash is itchy and hyperpigmented. She denies any history of eczema, although she does have allergies and asthma and there is a family history of eczema. There are no obvious contactants such as changes in soap, detergent, washing powder, dryer sheet, or fabric softener. No exposure to plants or animals. No exposure to new cosmetics, medicines, foods, or chemicals at work or at home she denies any difficulty breathing, wheezing, coughing, nasal congestion, rhinorrhea, or swelling of the lips, tongue, or throat.  Review of Systems:  Other than noted above, the patient denies any of the following symptoms: Systemic:  No fever, chills, sweats, weight loss, or fatigue. ENT:  No nasal congestion, rhinorrhea, sore throat, swelling of lips, tongue or throat. Resp:  No cough, wheezing, or shortness of breath. Skin:  No rash, itching, nodules, or suspicious lesions.  PMFSH:  Past medical history, family history, social history, meds, and allergies were reviewed.  Physical Exam:   Vital signs:  BP 149/97  Pulse 78  Temp 98.2 F (36.8 C) (Oral)  Resp 18  SpO2 100%  LMP 04/22/2012 Gen:  Alert, oriented, in no distress. ENT:  Pharynx clear, no intraoral lesions, moist mucous membranes. Lungs:  Clear to auscultation. Skin:  She has an eczematous, hyperpigmented, scaly rash around both eyes, left worse than right and also on the right forearm and to some extent the upper arm as well.  Assessment:  The encounter diagnosis was Eczema.  Plan:   1.  The following meds were prescribed:   New Prescriptions   HYDROCORTISONE CREAM 1 %    Apply to rash on face TID   HYDROXYZINE (ATARAX/VISTARIL) 25 MG TABLET    Take 1 at bedtime for itching.   PREDNISONE (DELTASONE) 20  MG TABLET    Take 1 tablet (20 mg total) by mouth 2 (two) times daily.   TRIAMCINOLONE CREAM (KENALOG) 0.1 %    Apply to rash on body TID   2.  The patient was instructed in symptomatic care and handouts were given. 3.  The patient was told to return if becoming worse in any way, if no better in 3 or 4 days, and given some red flag symptoms that would indicate earlier return.     Reuben Likes, MD 05/05/12 [phone]

## 2012-05-05 NOTE — ED Notes (Signed)
Pt c/o rash x2 months Started out on left arm and now has it below both eyes Went to Opthalmologist and was given Pataday 0.25 thinking it was allergies Sx include: itching, dark discoloration, swelling Denies: f/v/n/d, new hygiene products No hx of eczema.  She is alert w/no signs of acute distress.

## 2013-06-22 ENCOUNTER — Emergency Department (HOSPITAL_COMMUNITY): Payer: Medicare Other

## 2013-06-22 ENCOUNTER — Emergency Department (HOSPITAL_COMMUNITY)
Admission: EM | Admit: 2013-06-22 | Discharge: 2013-06-22 | Disposition: A | Discharge status: Home / Self Care | Payer: Medicare Other | Attending: Emergency Medicine | Admitting: Emergency Medicine

## 2013-06-22 ENCOUNTER — Encounter (HOSPITAL_COMMUNITY): Payer: Self-pay | Admitting: Emergency Medicine

## 2013-06-22 DIAGNOSIS — K8020 Calculus of gallbladder without cholecystitis without obstruction: Secondary | ICD-10-CM | POA: Insufficient documentation

## 2013-06-22 LAB — COMPREHENSIVE METABOLIC PANEL
ALT: 6 U/L (ref 0–35)
AST: 13 U/L (ref 0–37)
Albumin: 3.8 g/dL (ref 3.5–5.2)
Alkaline Phosphatase: 44 U/L (ref 39–117)
BUN: 18 mg/dL (ref 6–23)
CO2: 23 mEq/L (ref 19–32)
Calcium: 9 mg/dL (ref 8.4–10.5)
Chloride: 101 mEq/L (ref 96–112)
Creatinine, Ser: 0.67 mg/dL (ref 0.50–1.10)
GFR calc Af Amer: >90 mL/min (ref >90)
GFR calc non Af Amer: >90 mL/min (ref >90)
Glucose, Bld: 92 mg/dL (ref 70–99)
Potassium: 4 mEq/L (ref 3.7–5.3)
Sodium: 137 mEq/L (ref 137–147)
Total Bilirubin: 0.5 mg/dL (ref 0.3–1.2)
Total Protein: 7.2 g/dL (ref 6.0–8.3)

## 2013-06-22 LAB — CBC WITH DIFFERENTIAL/PLATELET
Basophils Absolute: 0 10*3/uL (ref 0.0–0.1)
Basophils Relative: 1 % (ref 0–1)
Eosinophils Absolute: 0.3 10*3/uL (ref 0.0–0.7)
Eosinophils Relative: 6 % — ABNORMAL HIGH (ref 0–5)
HCT: 35.6 % — ABNORMAL LOW (ref 36.0–46.0)
Hemoglobin: 12.5 g/dL (ref 12.0–15.0)
Lymphocytes Relative: 26 % (ref 12–46)
Lymphs Abs: 1.4 10*3/uL (ref 0.7–4.0)
MCH: 30.8 pg (ref 26.0–34.0)
MCHC: 35.1 g/dL (ref 30.0–36.0)
MCV: 87.7 fL (ref 78.0–100.0)
Monocytes Absolute: 0.4 10*3/uL (ref 0.1–1.0)
Monocytes Relative: 7 % (ref 3–12)
Neutro Abs: 3.3 10*3/uL (ref 1.7–7.7)
Neutrophils Relative %: 61 % (ref 43–77)
Platelets: 271 10*3/uL (ref 150–400)
RBC: 4.06 MIL/uL (ref 3.87–5.11)
RDW: 14.4 % (ref 11.5–15.5)
WBC: 5.4 10*3/uL (ref 4.0–10.5)

## 2013-06-22 LAB — LIPASE, BLOOD: Lipase: 31 U/L (ref 11–59)

## 2013-06-22 MED ORDER — PROMETHAZINE HCL 25 MG PO TABS: 25.0000 mg | ORAL_TABLET | Freq: Four times a day (QID) | ORAL | Status: DC | PRN

## 2013-06-22 MED ORDER — HYDROCODONE-ACETAMINOPHEN 5-325 MG PO TABS: 1.0000 | ORAL_TABLET | ORAL | Status: DC | PRN

## 2013-06-22 NOTE — ED Provider Notes (Signed)
CSN: [id number]     Arrival date & time 06/22/13  1212 History   First MD Initiated Contact with Patient 06/22/13 1248     Chief Complaint  Patient presents with  . Abdominal Pain      HPI Patient reports intermittent right upper quadrant abdominal pain. Her abdominal pain seems to be worse at nighttime. She has is some radiation up into her chest. If this is to have vomiting associated with this. She has no nausea or vomiting at this time. She currently has no abdominal pain. She has no shortness of breath. No copper congestion. No history of DVT or pulmonary embolism. No other complaints   History reviewed. No pertinent past medical history. No past surgical history on file. No family history on file. History  Substance Use Topics  . Smoking status: Never Smoker   . Smokeless tobacco: Not on file  . Alcohol Use: No   OB History   Grav Para Term Preterm Abortions TAB SAB Ect Mult Living                 Review of Systems  All other systems reviewed and are negative.      Allergies  Review of patient's allergies indicates no known allergies.  Home Medications   Current Outpatient Rx  Name  Route  Sig  Dispense  Refill  . ibuprofen (ADVIL,MOTRIN) 200 MG tablet   Oral   Take 200-400 mg by mouth every 6 (six) hours as needed for mild pain or moderate pain.         Marland Kitchen HYDROcodone-acetaminophen (NORCO/VICODIN) 5-325 MG per tablet   Oral   Take 1 tablet by mouth every 4 (four) hours as needed for moderate pain.   15 tablet   0   . promethazine (PHENERGAN) 25 MG tablet   Oral   Take 1 tablet (25 mg total) by mouth every 6 (six) hours as needed for nausea or vomiting.   15 tablet   0    BP 145/105  Pulse 77  Temp(Src) 98.4 F (36.9 C) (Oral)  Resp 18  SpO2 97%  LMP 06/15/2013 Physical Exam  Nursing note and vitals reviewed. Constitutional: She is oriented to person, place, and time. She appears well-developed and well-nourished. No distress.  HENT:  Head:  Normocephalic and atraumatic.  Eyes: EOM are normal.  Neck: Normal range of motion.  Cardiovascular: Normal rate, regular rhythm and normal heart sounds.   Pulmonary/Chest: Effort normal and breath sounds normal.  Abdominal: Soft. She exhibits no distension. There is no tenderness.  Musculoskeletal: Normal range of motion.  Neurological: She is alert and oriented to person, place, and time.  Skin: Skin is warm and dry.  Psychiatric: She has a normal mood and affect. Judgment normal.    ED Course  Procedures (including critical care time) Labs Review Labs Reviewed  CBC WITH DIFFERENTIAL - Abnormal; Notable for the following:    HCT 35.6 (*)    Eosinophils Relative 6 (*)    All other components within normal limits  COMPREHENSIVE METABOLIC PANEL  LIPASE, BLOOD   Imaging Review US Abdomen Complete  06/22/2013   CLINICAL DATA:  Abdominal pain.  EXAM: ULTRASOUND ABDOMEN COMPLETE  COMPARISON:  None.  FINDINGS: Gallbladder:  Gallbladder is contracted with multiple stones. No gallbladder wall thickening or pericholecystic fluid is noted. No sonographic Murphy's sign is noted.  Common bile duct:  Diameter: Measures 6 mm which is within normal limits.  Liver:  No focal lesion identified. Within  normal limits in parenchymal echogenicity.  IVC:  No abnormality visualized.  Pancreas:  Visualized portion unremarkable.  Spleen:  Size and appearance within normal limits.  Right Kidney:  Length: 10.6 cm. Echogenicity within normal limits. No mass or hydronephrosis visualized.  Left Kidney:  Length: 11.2 cm. Echogenicity within normal limits. No mass or hydronephrosis is noted.  Abdominal aorta:  No aneurysm visualized.  Other findings:  None.  IMPRESSION: Cholelithiasis without evidence of cholecystitis. No other abnormality seen in the abdomen.   Electronically Signed   By: Sabino Dick M.D.   On: 06/22/2013 15:14  I personally reviewed the imaging tests through PACS system I reviewed available  ER/hospitalization records through the EMR    EKG Interpretation None      MDM   Final diagnoses:  Cholelithiasis    Ultrasound of her gallbladder to evaluate for cholelithiasis. This may represent gastroesophageal reflux disease. Otherwise well-appearing. No symptoms at this time.    Hoy Morn, MD 06/22/13 [phone]

## 2013-06-22 NOTE — Discharge Instructions (Signed)
Cholelithiasis °Cholelithiasis (also called gallstones) is a form of gallbladder disease in which gallstones form in your gallbladder. The gallbladder is an organ that stores bile made in the liver, which helps digest fats. Gallstones begin as small crystals and slowly grow into stones. Gallstone pain occurs when the gallbladder spasms and a gallstone is blocking the duct. Pain can also occur when a stone passes out of the duct.  °RISK FACTORS °· Being female.   °· Having multiple pregnancies. Health care providers sometimes advise removing diseased gallbladders before future pregnancies.   °· Being obese. °· Eating a diet heavy in fried foods and fat.   °· Being older than 60 years and increasing age.   °· Prolonged use of medicines containing female hormones.   °· Having diabetes mellitus.   °· Rapidly losing weight.   °· Having a family history of gallstones (heredity).   °SYMPTOMS °· Nausea.   °· Vomiting. °· Abdominal pain.   °· Yellowing of the skin (jaundice).   °· Sudden pain. It may persist from several minutes to several hours. °· Fever.   °· Tenderness to the touch.  °In some cases, when gallstones do not move into the bile duct, people have no pain or symptoms. These are called "silent" gallstones.  °TREATMENT °Silent gallstones do not need treatment. In severe cases, emergency surgery may be required. Options for treatment include: °· Surgery to remove the gallbladder. This is the most common treatment. °· Medicines. These do not always work and may take 6 12 months or more to work. °· Shock wave treatment (extracorporeal biliary lithotripsy). In this treatment an ultrasound machine sends shock waves to the gallbladder to break gallstones into smaller pieces that can pass into the intestines or be dissolved by medicine. °HOME CARE INSTRUCTIONS  °· Only take over-the-counter or prescription medicines for pain, discomfort, or fever as directed by your health care provider.   °· Follow a low-fat diet until  seen again by your health care provider. Fat causes the gallbladder to contract, which can result in pain.   °· Follow up with your health care provider as directed. Attacks are almost always recurrent and surgery is usually required for permanent treatment.   °SEEK IMMEDIATE MEDICAL CARE IF:  °· Your pain increases and is not controlled by medicines.   °· You have a fever or persistent symptoms for more than 2 3 days.   °· You have a fever and your symptoms suddenly get worse.   °· You have persistent nausea and vomiting.   °MAKE SURE YOU:  °· Understand these instructions. °· Will watch your condition. °· Will get help right away if you are not doing well or get worse. °Document Released: 03/17/2005 Document Revised: 11/21/2012 Document Reviewed: 09/12/2012 °[employer]® Patient Information ©2014 [employer]. ° °

## 2013-06-22 NOTE — ED Notes (Signed)
U/S tech at bs for exam.  

## 2013-06-22 NOTE — ED Notes (Signed)
She c/o nocturnal episodes of epigastric pain and nausea "for a while now" which are becoming more frequent.  She states this epigastric pain radiates into upper back; and that occasionally she will have an episode of emesis.

## 2013-06-22 NOTE — ED Notes (Signed)
Initial Contact - pt to [facility] with c/o burning epigastric pain that occurs at night when she's trying to sleep.  Pt denies pain at this time.  Pt reports occasional episodes of emesis.  Reports has been happening for a few weeks, takes OTC acid medication "which usually help", "but I'm afraid of going to the doctors".  Pt denies CP, SOB, dysuria, fevers/chills or other complaints.  Skin PWD.  A+Ox4, ambulatory with steady gait.  NAD.

## 2013-07-02 ENCOUNTER — Ambulatory Visit (INDEPENDENT_AMBULATORY_CARE_PROVIDER_SITE_OTHER): Payer: Medicare Other | Admitting: Surgery

## 2013-07-02 ENCOUNTER — Encounter (INDEPENDENT_AMBULATORY_CARE_PROVIDER_SITE_OTHER): Payer: Self-pay | Admitting: Surgery

## 2013-07-02 VITALS — BP 128/74 | HR 76 | Temp 98.5°F | Resp 16 | Ht 60.0 in | Wt 120.6 lb

## 2013-07-02 DIAGNOSIS — K8010 Calculus of gallbladder with chronic cholecystitis without obstruction: Secondary | ICD-10-CM | POA: Insufficient documentation

## 2013-07-02 NOTE — Progress Notes (Signed)
Patient ID: [name], female   DOB: 03/04/1971, 42 y.o.   MRN: [id number]  Chief Complaint  Patient presents with  . Abdominal Pain    new pt    HPI [name] is a 43 y.o. female.  Referred by Dr. Patria Mane for evaluation of gallbladder disease Abdominal Pain Associated symptoms: nausea and vomiting   Associated symptoms: no chest pain, no chills, no constipation, no cough, no diarrhea, no fever, no hematuria, no sore throat and no vaginal bleeding    This is a 43 year old female who presents with a 19 year history of intermittent right upper quadrant and epigastric abdominal pain. Recently has become worse. She reports some abdominal bloating, nausea and vomiting, as well as epigastric pain that radiates around her right side to her back. She denies any diarrhea. She was evaluated in the emergency department and was noted to have cholelithiasis but no sign of cholecystitis. She was placed on a low-fat diet. She presents now for surgical evaluation. Past Medical History  Diagnosis Date  . Hypertension     PSH;  Eye surgery BTL  Family History  Problem Relation Age of Onset  . Heart disease Father     Social History History  Substance Use Topics  . Smoking status: Never Smoker   . Smokeless tobacco: Not on file  . Alcohol Use: No    No Known Allergies  Current Outpatient Prescriptions  Medication Sig Dispense Refill  . HYDROcodone-acetaminophen (NORCO/VICODIN) 5-325 MG per tablet Take 1 tablet by mouth every 4 (four) hours as needed for moderate pain.  15 tablet  0  . ibuprofen (ADVIL,MOTRIN) 200 MG tablet Take 200-400 mg by mouth every 6 (six) hours as needed for mild pain or moderate pain.      . promethazine (PHENERGAN) 25 MG tablet Take 1 tablet (25 mg total) by mouth every 6 (six) hours as needed for nausea or vomiting.  15 tablet  0   No current facility-administered medications for this visit.    Review of Systems Review of Systems  Constitutional:  Negative for fever, chills and unexpected weight change.  HENT: Negative for congestion, hearing loss, sore throat, trouble swallowing and voice change.   Eyes: Negative for visual disturbance.  Respiratory: Negative for cough and wheezing.   Cardiovascular: Negative for chest pain, palpitations and leg swelling.  Gastrointestinal: Positive for nausea, vomiting, abdominal pain and abdominal distention. Negative for diarrhea, constipation, blood in stool and anal bleeding.  Genitourinary: Negative for hematuria, vaginal bleeding and difficulty urinating.  Musculoskeletal: Negative for arthralgias.  Skin: Negative for rash and wound.  Neurological: Negative for seizures, syncope and headaches.  Hematological: Negative for adenopathy. Does not bruise/bleed easily.  Psychiatric/Behavioral: Negative for confusion.    Blood pressure 128/74, pulse 76, temperature 98.5 F (36.9 C), temperature source Oral, resp. rate 16, height 5' (1.524 m), weight 120 lb 9.6 oz (54.704 kg), last menstrual period 06/15/2013.  Physical Exam Physical Exam WDWN in NAD HEENT:  EOMI, sclera anicteric Neck:  No masses, no thyromegaly Lungs:  CTA bilaterally; normal respiratory effort CV:  Regular rate and rhythm; no murmurs Abd:  +bowel sounds, soft, non-tender, no masses Ext:  Well-perfused; no edema Skin:  Warm, dry; no sign of jaundice  Data Reviewed Lab Results  Component Value Date   WBC 5.4 06/22/2013   HGB 12.5 06/22/2013   HCT 35.6* 06/22/2013   MCV 87.7 06/22/2013   PLT 271 06/22/2013   Lab Results  Component Value Date  ALT 6 06/22/2013   AST 13 06/22/2013   ALKPHOS 44 06/22/2013   BILITOT 0.5 06/22/2013   Lab Results  Component Value Date   CREATININE 0.67 06/22/2013   BUN 18 06/22/2013   NA 137 06/22/2013   K 4.0 06/22/2013   CL 101 06/22/2013   CO2 23 06/22/2013   US Abdomen Complete  06/22/2013   CLINICAL DATA:  Abdominal pain.  EXAM: ULTRASOUND ABDOMEN COMPLETE  COMPARISON:  None.  FINDINGS:  Gallbladder:  Gallbladder is contracted with multiple stones. No gallbladder wall thickening or pericholecystic fluid is noted. No sonographic Murphy's sign is noted.  Common bile duct:  Diameter: Measures 6 mm which is within normal limits.  Liver:  No focal lesion identified. Within normal limits in parenchymal echogenicity.  IVC:  No abnormality visualized.  Pancreas:  Visualized portion unremarkable.  Spleen:  Size and appearance within normal limits.  Right Kidney:  Length: 10.6 cm. Echogenicity within normal limits. No mass or hydronephrosis visualized.  Left Kidney:  Length: 11.2 cm. Echogenicity within normal limits. No mass or hydronephrosis is noted.  Abdominal aorta:  No aneurysm visualized.  Other findings:  None.  IMPRESSION: Cholelithiasis without evidence of cholecystitis. No other abnormality seen in the abdomen.   Electronically Signed   By: Roque Lias M.D.   On: 06/22/2013 15:14      Assessment    Chronic calculus cholecystitis     Plan    Laparoscopic cholecystectomy with intraoperative cholangiogram.  The surgical procedure has been discussed with the patient.  Potential risks, benefits, alternative treatments, and expected outcomes have been explained.  All of the patient's questions at this time have been answered.  The likelihood of reaching the patient's treatment goal is good.  The patient understand the proposed surgical procedure and wishes to proceed.         [name] 07/02/2013, 2:47 PM

## 2013-07-30 ENCOUNTER — Other Ambulatory Visit (INDEPENDENT_AMBULATORY_CARE_PROVIDER_SITE_OTHER): Payer: Self-pay | Admitting: Surgery

## 2013-07-30 ENCOUNTER — Other Ambulatory Visit (INDEPENDENT_AMBULATORY_CARE_PROVIDER_SITE_OTHER): Payer: Self-pay

## 2013-07-30 DIAGNOSIS — K8010 Calculus of gallbladder with chronic cholecystitis without obstruction: Secondary | ICD-10-CM

## 2013-07-30 MED ORDER — OXYCODONE-ACETAMINOPHEN 5-325 MG PO TABS: 1.0000 | ORAL_TABLET | Freq: Four times a day (QID) | ORAL | Status: DC | PRN

## 2013-07-31 ENCOUNTER — Other Ambulatory Visit (INDEPENDENT_AMBULATORY_CARE_PROVIDER_SITE_OTHER): Payer: Self-pay | Admitting: Surgery

## 2013-08-16 ENCOUNTER — Ambulatory Visit (INDEPENDENT_AMBULATORY_CARE_PROVIDER_SITE_OTHER): Payer: Medicare Other | Admitting: Surgery

## 2013-08-16 ENCOUNTER — Encounter (INDEPENDENT_AMBULATORY_CARE_PROVIDER_SITE_OTHER): Payer: Self-pay | Admitting: Surgery

## 2013-08-16 VITALS — BP 142/92 | HR 82 | Temp 97.6°F | Resp 12 | Ht 60.0 in | Wt 119.4 lb

## 2013-08-16 DIAGNOSIS — K8010 Calculus of gallbladder with chronic cholecystitis without obstruction: Secondary | ICD-10-CM

## 2013-08-16 NOTE — Progress Notes (Signed)
S/p lap chole with IOC on 07/30/13 for chronic calculus cholecystitis.  She is doing quite well.  The tenderness is resolved.  Appetite and bowel movements are normal.  Incisions healed with no sign of infection.    Full activity Regular diet Follow-up PRN.  [name]. Georgette Dover, MD, [facility] Surgery  General/ Trauma Surgery  08/16/2013 9:11 AM

## 2014-04-30 ENCOUNTER — Ambulatory Visit: Payer: Medicare Other | Attending: Internal Medicine | Admitting: Internal Medicine

## 2014-04-30 ENCOUNTER — Encounter: Payer: Self-pay | Admitting: Internal Medicine

## 2014-04-30 VITALS — BP 160/80 | HR 100 | Temp 98.8°F | Resp 16 | Wt 146.2 lb

## 2014-04-30 DIAGNOSIS — R030 Elevated blood-pressure reading, without diagnosis of hypertension: Secondary | ICD-10-CM

## 2014-04-30 DIAGNOSIS — IMO0001 Reserved for inherently not codable concepts without codable children: Secondary | ICD-10-CM

## 2014-04-30 DIAGNOSIS — I10 Essential (primary) hypertension: Secondary | ICD-10-CM | POA: Insufficient documentation

## 2014-04-30 DIAGNOSIS — Z1231 Encounter for screening mammogram for malignant neoplasm of breast: Secondary | ICD-10-CM

## 2014-04-30 DIAGNOSIS — Z833 Family history of diabetes mellitus: Secondary | ICD-10-CM | POA: Insufficient documentation

## 2014-04-30 LAB — COMPLETE METABOLIC PANEL WITH GFR
ALBUMIN: 4 g/dL (ref 3.5–5.2)
ALK PHOS: 56 U/L (ref 39–117)
ALT: 11 U/L (ref 0–35)
AST: 16 U/L (ref 0–37)
BUN: 19 mg/dL (ref 6–23)
CHLORIDE: 104 meq/L (ref 96–112)
CO2: 26 mEq/L (ref 19–32)
CREATININE: 0.63 mg/dL (ref 0.50–1.10)
Calcium: 9.1 mg/dL (ref 8.4–10.5)
GFR, Est Non African American: >89 mL/min
Glucose, Bld: 84 mg/dL (ref 70–99)
POTASSIUM: 4.7 meq/L (ref 3.5–5.3)
Sodium: 138 mEq/L (ref 135–145)
TOTAL PROTEIN: 7.1 g/dL (ref 6.0–8.3)
Total Bilirubin: 0.5 mg/dL (ref 0.2–1.2)

## 2014-04-30 LAB — LIPID PANEL
Cholesterol: 176 mg/dL (ref 0–200)
HDL: 56 mg/dL (ref >39)
LDL CALC: 101 mg/dL — AB (ref 0–99)
TRIGLYCERIDES: 96 mg/dL (ref <150)
Total CHOL/HDL Ratio: 3.1 Ratio
VLDL: 19 mg/dL (ref 0–40)

## 2014-04-30 LAB — CBC WITH DIFFERENTIAL/PLATELET
Basophils Absolute: 0.1 10*3/uL (ref 0.0–0.1)
Basophils Relative: 1 % (ref 0–1)
EOS PCT: 6 % — AB (ref 0–5)
Eosinophils Absolute: 0.4 10*3/uL (ref 0.0–0.7)
HCT: 39.9 % (ref 36.0–46.0)
Hemoglobin: 13.5 g/dL (ref 12.0–15.0)
LYMPHS PCT: 19 % (ref 12–46)
Lymphs Abs: 1.3 10*3/uL (ref 0.7–4.0)
MCH: 30.7 pg (ref 26.0–34.0)
MCHC: 33.8 g/dL (ref 30.0–36.0)
MCV: 90.7 fL (ref 78.0–100.0)
MPV: 9.6 fL (ref 8.6–12.4)
Monocytes Absolute: 0.5 10*3/uL (ref 0.1–1.0)
Monocytes Relative: 7 % (ref 3–12)
Neutro Abs: 4.7 10*3/uL (ref 1.7–7.7)
Neutrophils Relative %: 67 % (ref 43–77)
Platelets: 283 10*3/uL (ref 150–400)
RBC: 4.4 MIL/uL (ref 3.87–5.11)
RDW: 15.1 % (ref 11.5–15.5)
WBC: 7 10*3/uL (ref 4.0–10.5)

## 2014-04-30 LAB — TSH: TSH: 0.801 u[IU]/mL (ref 0.350–4.500)

## 2014-04-30 MED ORDER — CLONIDINE HCL 0.1 MG PO TABS
0.2000 mg | ORAL_TABLET | Freq: Once | ORAL | Status: AC
  Administered 2014-04-30: 0.2 mg via ORAL

## 2014-04-30 MED ORDER — LISINOPRIL-HYDROCHLOROTHIAZIDE 10-12.5 MG PO TABS: 1.0000 | ORAL_TABLET | Freq: Every day | ORAL | Status: DC

## 2014-04-30 NOTE — Patient Instructions (Signed)
DASH Eating Plan °DASH stands for "Dietary Approaches to Stop Hypertension." The DASH eating plan is a healthy eating plan that has been shown to reduce high blood pressure (hypertension). Additional health benefits may include reducing the risk of type 2 diabetes mellitus, heart disease, and stroke. The DASH eating plan may also help with weight loss. °WHAT DO I NEED TO KNOW ABOUT THE DASH EATING PLAN? °For the DASH eating plan, you will follow these general guidelines: °· Choose foods with a percent daily value for sodium of less than 5% (as listed on the food label). °· Use salt-free seasonings or herbs instead of table salt or sea salt. °· Check with your health care provider or pharmacist before using salt substitutes. °· Eat lower-sodium products, often labeled as "lower sodium" or "no salt added." °· Eat fresh foods. °· Eat more vegetables, fruits, and low-fat dairy products. °· Choose whole grains. Look for the word "whole" as the first word in the ingredient list. °· Choose fish and skinless chicken or turkey more often than red meat. Limit fish, poultry, and meat to 6 oz (170 g) each day. °· Limit sweets, desserts, sugars, and sugary drinks. °· Choose heart-healthy fats. °· Limit cheese to 1 oz (28 g) per day. °· Eat more home-cooked food and less restaurant, buffet, and fast food. °· Limit fried foods. °· Cook foods using methods other than frying. °· Limit canned vegetables. If you do use them, rinse them well to decrease the sodium. °· When eating at a restaurant, ask that your food be prepared with less salt, or no salt if possible. °WHAT FOODS CAN I EAT? °Seek help from a dietitian for individual calorie needs. °Grains °Whole grain or whole wheat bread. Brown rice. Whole grain or whole wheat pasta. Quinoa, bulgur, and whole grain cereals. Low-sodium cereals. Corn or whole wheat flour tortillas. Whole grain cornbread. Whole grain crackers. Low-sodium crackers. °Vegetables °Fresh or frozen vegetables  (raw, steamed, roasted, or grilled). Low-sodium or reduced-sodium tomato and vegetable juices. Low-sodium or reduced-sodium tomato sauce and paste. Low-sodium or reduced-sodium canned vegetables.  °Fruits °All fresh, canned (in natural juice), or frozen fruits. °Meat and Other Protein Products °Ground beef (85% or leaner), grass-fed beef, or beef trimmed of fat. Skinless chicken or turkey. Ground chicken or turkey. Pork trimmed of fat. All fish and seafood. Eggs. Dried beans, peas, or lentils. Unsalted nuts and seeds. Unsalted canned beans. °Dairy °Low-fat dairy products, such as skim or 1% milk, 2% or reduced-fat cheeses, low-fat ricotta or cottage cheese, or plain low-fat yogurt. Low-sodium or reduced-sodium cheeses. °Fats and Oils °Tub margarines without trans fats. Light or reduced-fat mayonnaise and salad dressings (reduced sodium). Avocado. Safflower, olive, or canola oils. Natural peanut or almond butter. °Other °Unsalted popcorn and pretzels. °The items listed above may not be a complete list of recommended foods or beverages. Contact your dietitian for more options. °WHAT FOODS ARE NOT RECOMMENDED? °Grains °White bread. White pasta. White rice. Refined cornbread. Bagels and croissants. Crackers that contain trans fat. °Vegetables °Creamed or fried vegetables. Vegetables in a cheese sauce. Regular canned vegetables. Regular canned tomato sauce and paste. Regular tomato and vegetable juices. °Fruits °Dried fruits. Canned fruit in light or heavy syrup. Fruit juice. °Meat and Other Protein Products °Fatty cuts of meat. Ribs, chicken wings, bacon, sausage, bologna, salami, chitterlings, fatback, hot dogs, bratwurst, and packaged luncheon meats. Salted nuts and seeds. Canned beans with salt. °Dairy °Whole or 2% milk, cream, half-and-half, and cream cheese. Whole-fat or sweetened yogurt. Full-fat   cheeses or blue cheese. Nondairy creamers and whipped toppings. Processed cheese, cheese spreads, or cheese  curds. °Condiments °Onion and garlic salt, seasoned salt, table salt, and sea salt. Canned and packaged gravies. Worcestershire sauce. Tartar sauce. Barbecue sauce. Teriyaki sauce. Soy sauce, including reduced sodium. Steak sauce. Fish sauce. Oyster sauce. Cocktail sauce. Horseradish. Ketchup and mustard. Meat flavorings and tenderizers. Bouillon cubes. Hot sauce. Tabasco sauce. Marinades. Taco seasonings. Relishes. °Fats and Oils °Butter, stick margarine, lard, shortening, ghee, and bacon fat. Coconut, palm kernel, or palm oils. Regular salad dressings. °Other °Pickles and olives. Salted popcorn and pretzels. °The items listed above may not be a complete list of foods and beverages to avoid. Contact your dietitian for more information. °WHERE CAN I FIND MORE INFORMATION? °[employer], [employer], [employer]: www.nhlbi.nih.gov/health/health-topics/topics/dash/ °Document Released: 03/10/2011 Document Revised: 08/05/2013 Document Reviewed: 01/23/2013 °ExitCare® Patient Information ©2015 [employer]. This information is not intended to replace advice given to you by your health care provider. Make sure you discuss any questions you have with your health care provider. ° °

## 2014-04-30 NOTE — Progress Notes (Signed)
Patient here to establish care Patient takes no prescribed medications Has not seen a doctor in about ten years Presents in office with elevated blood pressure 0.2mg  catapress given per office protocol

## 2014-04-30 NOTE — Progress Notes (Signed)
Patient Demographics  [name], is a 44 y.o. female  VZD:[id number]  IRJ:[id number]  DOB - 01/07/1971  CC:  Chief Complaint  Patient presents with  . Establish Care       HPI: [name] is a 44 y.o. female here today to establish medical care.patient reported to have been diagnosed with hypertension was 10 years ago when she was pregnant after that she reported her blood pressure was normal and has not taken any medications, today her blood pressure is elevated, was given clonidine, repeat manual blood pressure is 160/80, denies any headache dizziness chest and shortness of breath, she does report family history of diabetes. Patient has No headache, No chest pain, No abdominal pain - No Nausea, No new weakness tingling or numbness, No Cough - SOB.  No Known Allergies Past Medical History  Diagnosis Date  . Hypertension    Current Outpatient Prescriptions on File Prior to Visit  Medication Sig Dispense Refill  . HYDROcodone-acetaminophen (NORCO/VICODIN) 5-325 MG per tablet Take 1 tablet by mouth every 4 (four) hours as needed for moderate pain. 15 tablet 0  . ibuprofen (ADVIL,MOTRIN) 200 MG tablet Take 200-400 mg by mouth every 6 (six) hours as needed for mild pain or moderate pain.     No current facility-administered medications on file prior to visit.   Family History  Problem Relation Age of Onset  . Heart disease Father   . Hypertension Mother   . Diabetes Maternal Grandfather    History   Social History  . Marital Status: Single    Spouse Name: N/A    Number of Children: N/A  . Years of Education: N/A   Occupational History  . Not on file.   Social History Main Topics  . Smoking status: Never Smoker   . Smokeless tobacco: Not on file  . Alcohol Use: No  . Drug Use: No  . Sexual Activity: Not on file   Other Topics Concern  . Not on file   Social History Narrative    Review of Systems: Constitutional: Negative for fever, chills,  diaphoresis, activity change, appetite change and fatigue. HENT: Negative for ear pain, nosebleeds, congestion, facial swelling, rhinorrhea, neck pain, neck stiffness and ear discharge.  Eyes: Negative for pain, discharge, redness, itching and visual disturbance. Respiratory: Negative for cough, choking, chest tightness, shortness of breath, wheezing and stridor.  Cardiovascular: Negative for chest pain, palpitations and leg swelling. Gastrointestinal: Negative for abdominal distention. Genitourinary: Negative for dysuria, urgency, frequency, hematuria, flank pain, decreased urine volume, difficulty urinating and dyspareunia.  Musculoskeletal: Negative for back pain, joint swelling, arthralgia and gait problem. Neurological: Negative for dizziness, tremors, seizures, syncope, facial asymmetry, speech difficulty, weakness, light-headedness, numbness and headaches.  Hematological: Negative for adenopathy. Does not bruise/bleed easily. Psychiatric/Behavioral: Negative for hallucinations, behavioral problems, confusion, dysphoric mood, decreased concentration and agitation.    Objective:   Filed Vitals:   04/30/14 1053  BP: 160/80  Pulse:   Temp:   Resp:     Physical Exam: Constitutional: Patient appears well-developed and well-nourished. No distress. HENT: Normocephalic, atraumatic, External right and left ear normal. Oropharynx is clear and moist.  Eyes: Conjunctivae and EOM are normal. PERRLA, no scleral icterus. Neck: Normal ROM. Neck supple. No JVD. No tracheal deviation. No thyromegaly. CVS: RRR, S1/S2 +, no murmurs, no gallops, no carotid bruit.  Pulmonary: Effort and breath sounds normal, no stridor, rhonchi, wheezes, rales.  Abdominal: Soft. BS +, no distension, tenderness, rebound or guarding.  Musculoskeletal: Normal  range of motion. No edema and no tenderness.  Lymphadenopathy: No lymphadenopathy noted, cervical, inguinal or axillary Neuro: Alert. Normal reflexes, muscle tone  coordination. No cranial nerve deficit. Skin: Skin is warm and dry. No rash noted. Not diaphoretic. No erythema. No pallor. Psychiatric: Normal mood and affect. Behavior, judgment, thought content normal.  Lab Results  Component Value Date   WBC 5.4 06/22/2013   HGB 12.5 06/22/2013   HCT 35.6* 06/22/2013   MCV 87.7 06/22/2013   PLT 271 06/22/2013   Lab Results  Component Value Date   CREATININE 0.67 06/22/2013   BUN 18 06/22/2013   NA 137 06/22/2013   K 4.0 06/22/2013   CL 101 06/22/2013   CO2 23 06/22/2013    No results found for: HGBA1C Lipid Panel  No results found for: CHOL, TRIG, HDL, CHOLHDL, VLDL, LDLCALC     Assessment and plan:   1. Elevated blood pressure  - cloNIDine (CATAPRES) tablet 0.2 mg; Take 2 tablets (0.2 mg total) by mouth once.  2. Essential hypertension Advised patient for DASH diet, started on lisinopril/hydrochlorothiazide, ordered baseline blood work patient will come back in 2 weeks for nurse visit BP check  - CBC with Differential/Platelet - COMPLETE METABOLIC PANEL WITH GFR - TSH - Lipid panel - lisinopril-hydrochlorothiazide (PRINZIDE,ZESTORETIC) 10-12.5 MG per tablet; Take 1 tablet by mouth daily.  Dispense: 90 tablet; Refill: 3  3. Encounter for screening mammogram for breast cancer  - MM DIGITAL SCREENING BILATERAL; Future  4. Family history of diabetes mellitus (DM) Will check blood chemistry      Health Maintenance  -Pap Smear: will be scheduled  -Mammogram: ordered  -Vaccinations:  Patient declines flu shot    Return in about 3 months (around 07/30/2014) for hypertension, BP check in 2 weeks/Nurse Visit, Schedule Appt with Dr Burman Freestone for PAP.   Lorayne Marek, MD

## 2014-05-01 ENCOUNTER — Telehealth: Payer: Self-pay | Admitting: *Deleted

## 2014-05-01 NOTE — Telephone Encounter (Signed)
-----   Message from Lorayne Marek, MD sent at 05/01/2014  9:27 AM EST ----- Call and let the Patient know that blood work is normal.

## 2014-05-01 NOTE — Telephone Encounter (Signed)
Pt aware of lab results 

## 2014-05-08 ENCOUNTER — Encounter: Payer: Self-pay | Admitting: Family Medicine

## 2014-05-08 ENCOUNTER — Ambulatory Visit: Payer: Medicare Other | Attending: Family Medicine | Admitting: Family Medicine

## 2014-05-08 ENCOUNTER — Other Ambulatory Visit (HOSPITAL_COMMUNITY)
Admission: RE | Admit: 2014-05-08 | Discharge: 2014-05-08 | Disposition: A | Discharge status: Home / Self Care | Payer: Medicare Other | Source: Ambulatory Visit | Attending: Family Medicine | Admitting: Family Medicine

## 2014-05-08 VITALS — BP 118/79 | HR 99 | Temp 98.3°F | Resp 16 | Ht 65.0 in | Wt 149.0 lb

## 2014-05-08 DIAGNOSIS — Z1151 Encounter for screening for human papillomavirus (HPV): Secondary | ICD-10-CM | POA: Insufficient documentation

## 2014-05-08 DIAGNOSIS — Z113 Encounter for screening for infections with a predominantly sexual mode of transmission: Secondary | ICD-10-CM | POA: Diagnosis present

## 2014-05-08 DIAGNOSIS — Z124 Encounter for screening for malignant neoplasm of cervix: Secondary | ICD-10-CM | POA: Insufficient documentation

## 2014-05-08 DIAGNOSIS — N760 Acute vaginitis: Secondary | ICD-10-CM | POA: Diagnosis present

## 2014-05-08 DIAGNOSIS — N898 Other specified noninflammatory disorders of vagina: Secondary | ICD-10-CM

## 2014-05-08 NOTE — Patient Instructions (Signed)
Ms. Blanch Media,  It was nice to meet you. You will be called with pap results.  BP check in 2 weeks with nurse   See Dr. Annitta Needs in 3 months from last visit for hypertension management  Dr. Adrian Blackwater

## 2014-05-08 NOTE — Progress Notes (Signed)
   Subjective:    Patient ID: [name], female    DOB: 1970-08-15, 44 y.o.   MRN: [id number] CC: pap  HPI PCP: Dr. Annitta Needs   44 yo F here for pap. No hx of abnormal pap. Scant vaginal discharge with mild odor. Sex with husband only. Married for > 25 yrs.   Soc hx:  Non smoker  Review of Systems As per HPI      Objective:   Physical Exam BP 118/79 mmHg  Pulse 99  Temp(Src) 98.3 F (36.8 C) (Oral)  Resp 16  Ht 5\' 5"  (1.651 m)  Wt 149 lb (67.586 kg)  BMI 24.79 kg/m2  SpO2 97% General appearance: alert, cooperative and no distress Abdomen: soft, non-tender; bowel sounds normal; no masses,  no organomegaly Pelvic: cervix normal in appearance, external genitalia normal, no adnexal masses or tenderness, no cervical motion tenderness, positive findings: vaginal discharge:  normal and physiologic and scant, rectovaginal septum normal and uterus normal size, shape, and consistency    Assessment & Plan:

## 2014-05-08 NOTE — Progress Notes (Signed)
Pt comes in today for Pap smear/STD screening C/o freq vaginal d/c with odor Denies abdominal pain  LMP- 04/20/14

## 2014-05-08 NOTE — Assessment & Plan Note (Signed)
Scant discharge. Suspect normal physiological d/c vs BV Will await results

## 2014-05-08 NOTE — Assessment & Plan Note (Signed)
Pap and ancillary testing done today

## 2014-05-09 ENCOUNTER — Telehealth: Payer: Self-pay | Admitting: *Deleted

## 2014-05-09 LAB — CERVICOVAGINAL ANCILLARY ONLY
CHLAMYDIA, DNA PROBE: NEGATIVE
NEISSERIA GONORRHEA: NEGATIVE
Wet Prep (BD Affirm): NEGATIVE
Wet Prep (BD Affirm): NEGATIVE
Wet Prep (BD Affirm): NEGATIVE

## 2014-05-09 NOTE — Telephone Encounter (Signed)
LVM with normal tes Will call back with pap smear results

## 2014-05-09 NOTE — Telephone Encounter (Signed)
-----   Message from Minerva Ends, MD sent at 05/09/2014  1:29 PM EST ----- Neg wet prep and Gc/chlam

## 2014-05-13 LAB — CYTOLOGY - PAP

## 2014-05-15 ENCOUNTER — Telehealth: Payer: Self-pay | Admitting: *Deleted

## 2014-05-15 ENCOUNTER — Ambulatory Visit: Payer: Medicare Other | Attending: Internal Medicine | Admitting: *Deleted

## 2014-05-15 VITALS — BP 126/78 | HR 93 | Temp 98.6°F | Resp 16

## 2014-05-15 DIAGNOSIS — IMO0001 Reserved for inherently not codable concepts without codable children: Secondary | ICD-10-CM

## 2014-05-15 DIAGNOSIS — Z833 Family history of diabetes mellitus: Secondary | ICD-10-CM | POA: Diagnosis not present

## 2014-05-15 DIAGNOSIS — I10 Essential (primary) hypertension: Secondary | ICD-10-CM | POA: Diagnosis not present

## 2014-05-15 DIAGNOSIS — R030 Elevated blood-pressure reading, without diagnosis of hypertension: Secondary | ICD-10-CM

## 2014-05-15 NOTE — Telephone Encounter (Signed)
-----   Message from Minerva Ends, MD sent at 05/15/2014  9:10 AM EST ----- Negative pap smear, repeat in 5 years

## 2014-05-15 NOTE — Telephone Encounter (Signed)
Left voice message with normal results If any question return call

## 2014-05-15 NOTE — Progress Notes (Signed)
Patient presents for BP check Med list reviewed; states taking all meds as directed Patient denies headaches, blurred vision, SHOB, chest pain or pressure Discussed need for low sodium diet and using Mrs. Dash as alternative to salt Discussed walking 30 minutes per day for exercise Declined flu vaccine PHQ-9 score of 1; GAD 0  BP 126/78 P 93 R 16 T  98.6 oral SPO2  99%  Patient advised to call for med refills at least 7 days before running out so as not to go without. Patient aware that she is to f/u with PCP 3 months from last visit (Due 07/30/14)  Patient given literature on DASH Eating Plan

## 2014-05-15 NOTE — Patient Instructions (Signed)
DASH Eating Plan °DASH stands for "Dietary Approaches to Stop Hypertension." The DASH eating plan is a healthy eating plan that has been shown to reduce high blood pressure (hypertension). Additional health benefits may include reducing the risk of type 2 diabetes mellitus, heart disease, and stroke. The DASH eating plan may also help with weight loss. °WHAT DO I NEED TO KNOW ABOUT THE DASH EATING PLAN? °For the DASH eating plan, you will follow these general guidelines: °· Choose foods with a percent daily value for sodium of less than 5% (as listed on the food label). °· Use salt-free seasonings or herbs instead of table salt or sea salt. °· Check with your health care provider or pharmacist before using salt substitutes. °· Eat lower-sodium products, often labeled as "lower sodium" or "no salt added." °· Eat fresh foods. °· Eat more vegetables, fruits, and low-fat dairy products. °· Choose whole grains. Look for the word "whole" as the first word in the ingredient list. °· Choose fish and skinless chicken or turkey more often than red meat. Limit fish, poultry, and meat to 6 oz (170 g) each day. °· Limit sweets, desserts, sugars, and sugary drinks. °· Choose heart-healthy fats. °· Limit cheese to 1 oz (28 g) per day. °· Eat more home-cooked food and less restaurant, buffet, and fast food. °· Limit fried foods. °· Cook foods using methods other than frying. °· Limit canned vegetables. If you do use them, rinse them well to decrease the sodium. °· When eating at a restaurant, ask that your food be prepared with less salt, or no salt if possible. °WHAT FOODS CAN I EAT? °Seek help from a dietitian for individual calorie needs. °Grains °Whole grain or whole wheat bread. Brown rice. Whole grain or whole wheat pasta. Quinoa, bulgur, and whole grain cereals. Low-sodium cereals. Corn or whole wheat flour tortillas. Whole grain cornbread. Whole grain crackers. Low-sodium crackers. °Vegetables °Fresh or frozen vegetables  (raw, steamed, roasted, or grilled). Low-sodium or reduced-sodium tomato and vegetable juices. Low-sodium or reduced-sodium tomato sauce and paste. Low-sodium or reduced-sodium canned vegetables.  °Fruits °All fresh, canned (in natural juice), or frozen fruits. °Meat and Other Protein Products °Ground beef (85% or leaner), grass-fed beef, or beef trimmed of fat. Skinless chicken or turkey. Ground chicken or turkey. Pork trimmed of fat. All fish and seafood. Eggs. Dried beans, peas, or lentils. Unsalted nuts and seeds. Unsalted canned beans. °Dairy °Low-fat dairy products, such as skim or 1% milk, 2% or reduced-fat cheeses, low-fat ricotta or cottage cheese, or plain low-fat yogurt. Low-sodium or reduced-sodium cheeses. °Fats and Oils °Tub margarines without trans fats. Light or reduced-fat mayonnaise and salad dressings (reduced sodium). Avocado. Safflower, olive, or canola oils. Natural peanut or almond butter. °Other °Unsalted popcorn and pretzels. °The items listed above may not be a complete list of recommended foods or beverages. Contact your dietitian for more options. °WHAT FOODS ARE NOT RECOMMENDED? °Grains °White bread. White pasta. White rice. Refined cornbread. Bagels and croissants. Crackers that contain trans fat. °Vegetables °Creamed or fried vegetables. Vegetables in a cheese sauce. Regular canned vegetables. Regular canned tomato sauce and paste. Regular tomato and vegetable juices. °Fruits °Dried fruits. Canned fruit in light or heavy syrup. Fruit juice. °Meat and Other Protein Products °Fatty cuts of meat. Ribs, chicken wings, bacon, sausage, bologna, salami, chitterlings, fatback, hot dogs, bratwurst, and packaged luncheon meats. Salted nuts and seeds. Canned beans with salt. °Dairy °Whole or 2% milk, cream, half-and-half, and cream cheese. Whole-fat or sweetened yogurt. Full-fat   cheeses or blue cheese. Nondairy creamers and whipped toppings. Processed cheese, cheese spreads, or cheese  curds. °Condiments °Onion and garlic salt, seasoned salt, table salt, and sea salt. Canned and packaged gravies. Worcestershire sauce. Tartar sauce. Barbecue sauce. Teriyaki sauce. Soy sauce, including reduced sodium. Steak sauce. Fish sauce. Oyster sauce. Cocktail sauce. Horseradish. Ketchup and mustard. Meat flavorings and tenderizers. Bouillon cubes. Hot sauce. Tabasco sauce. Marinades. Taco seasonings. Relishes. °Fats and Oils °Butter, stick margarine, lard, shortening, ghee, and bacon fat. Coconut, palm kernel, or palm oils. Regular salad dressings. °Other °Pickles and olives. Salted popcorn and pretzels. °The items listed above may not be a complete list of foods and beverages to avoid. Contact your dietitian for more information. °WHERE CAN I FIND MORE INFORMATION? °[employer], [employer], [employer]: www.nhlbi.nih.gov/health/health-topics/topics/dash/ °Document Released: 03/10/2011 Document Revised: 08/05/2013 Document Reviewed: 01/23/2013 °ExitCare® Patient Information ©2015 [employer]. This information is not intended to replace advice given to you by your health care provider. Make sure you discuss any questions you have with your health care provider. ° °

## 2014-08-19 ENCOUNTER — Ambulatory Visit: Payer: Medicare Other | Attending: Internal Medicine | Admitting: Internal Medicine

## 2014-08-19 ENCOUNTER — Encounter: Payer: Self-pay | Admitting: Internal Medicine

## 2014-08-19 VITALS — BP 132/92 | HR 85 | Temp 98.0°F | Resp 16 | Wt 150.0 lb

## 2014-08-19 DIAGNOSIS — I10 Essential (primary) hypertension: Secondary | ICD-10-CM | POA: Diagnosis not present

## 2014-08-19 DIAGNOSIS — Z1231 Encounter for screening mammogram for malignant neoplasm of breast: Secondary | ICD-10-CM

## 2014-08-19 DIAGNOSIS — Z9049 Acquired absence of other specified parts of digestive tract: Secondary | ICD-10-CM | POA: Insufficient documentation

## 2014-08-19 DIAGNOSIS — J45909 Unspecified asthma, uncomplicated: Secondary | ICD-10-CM | POA: Insufficient documentation

## 2014-08-19 DIAGNOSIS — Z8709 Personal history of other diseases of the respiratory system: Secondary | ICD-10-CM

## 2014-08-19 LAB — COMPLETE METABOLIC PANEL WITH GFR
ALBUMIN: 3.9 g/dL (ref 3.5–5.2)
ALT: 13 U/L (ref 0–35)
AST: 17 U/L (ref 0–37)
Alkaline Phosphatase: 43 U/L (ref 39–117)
BILIRUBIN TOTAL: 0.4 mg/dL (ref 0.2–1.2)
BUN: 17 mg/dL (ref 6–23)
CALCIUM: 9.4 mg/dL (ref 8.4–10.5)
CHLORIDE: 105 meq/L (ref 96–112)
CO2: 25 meq/L (ref 19–32)
Creat: 0.83 mg/dL (ref 0.50–1.10)
GFR, EST NON AFRICAN AMERICAN: 87 mL/min
Glucose, Bld: 85 mg/dL (ref 70–99)
Potassium: 4.3 mEq/L (ref 3.5–5.3)
SODIUM: 140 meq/L (ref 135–145)
TOTAL PROTEIN: 7 g/dL (ref 6.0–8.3)

## 2014-08-19 MED ORDER — ALBUTEROL SULFATE HFA 108 (90 BASE) MCG/ACT IN AERS: 2.0000 | INHALATION_SPRAY | Freq: Four times a day (QID) | RESPIRATORY_TRACT | Status: DC | PRN

## 2014-08-19 NOTE — Patient Instructions (Signed)
DASH Eating Plan °DASH stands for "Dietary Approaches to Stop Hypertension." The DASH eating plan is a healthy eating plan that has been shown to reduce high blood pressure (hypertension). Additional health benefits may include reducing the risk of type 2 diabetes mellitus, heart disease, and stroke. The DASH eating plan may also help with weight loss. °WHAT DO I NEED TO KNOW ABOUT THE DASH EATING PLAN? °For the DASH eating plan, you will follow these general guidelines: °· Choose foods with a percent daily value for sodium of less than 5% (as listed on the food label). °· Use salt-free seasonings or herbs instead of table salt or sea salt. °· Check with your health care provider or pharmacist before using salt substitutes. °· Eat lower-sodium products, often labeled as "lower sodium" or "no salt added." °· Eat fresh foods. °· Eat more vegetables, fruits, and low-fat dairy products. °· Choose whole grains. Look for the word "whole" as the first word in the ingredient list. °· Choose fish and skinless chicken or turkey more often than red meat. Limit fish, poultry, and meat to 6 oz (170 g) each day. °· Limit sweets, desserts, sugars, and sugary drinks. °· Choose heart-healthy fats. °· Limit cheese to 1 oz (28 g) per day. °· Eat more home-cooked food and less restaurant, buffet, and fast food. °· Limit fried foods. °· Cook foods using methods other than frying. °· Limit canned vegetables. If you do use them, rinse them well to decrease the sodium. °· When eating at a restaurant, ask that your food be prepared with less salt, or no salt if possible. °WHAT FOODS CAN I EAT? °Seek help from a dietitian for individual calorie needs. °Grains °Whole grain or whole wheat bread. Brown rice. Whole grain or whole wheat pasta. Quinoa, bulgur, and whole grain cereals. Low-sodium cereals. Corn or whole wheat flour tortillas. Whole grain cornbread. Whole grain crackers. Low-sodium crackers. °Vegetables °Fresh or frozen vegetables  (raw, steamed, roasted, or grilled). Low-sodium or reduced-sodium tomato and vegetable juices. Low-sodium or reduced-sodium tomato sauce and paste. Low-sodium or reduced-sodium canned vegetables.  °Fruits °All fresh, canned (in natural juice), or frozen fruits. °Meat and Other Protein Products °Ground beef (85% or leaner), grass-fed beef, or beef trimmed of fat. Skinless chicken or turkey. Ground chicken or turkey. Pork trimmed of fat. All fish and seafood. Eggs. Dried beans, peas, or lentils. Unsalted nuts and seeds. Unsalted canned beans. °Dairy °Low-fat dairy products, such as skim or 1% milk, 2% or reduced-fat cheeses, low-fat ricotta or cottage cheese, or plain low-fat yogurt. Low-sodium or reduced-sodium cheeses. °Fats and Oils °Tub margarines without trans fats. Light or reduced-fat mayonnaise and salad dressings (reduced sodium). Avocado. Safflower, olive, or canola oils. Natural peanut or almond butter. °Other °Unsalted popcorn and pretzels. °The items listed above may not be a complete list of recommended foods or beverages. Contact your dietitian for more options. °WHAT FOODS ARE NOT RECOMMENDED? °Grains °White bread. White pasta. White rice. Refined cornbread. Bagels and croissants. Crackers that contain trans fat. °Vegetables °Creamed or fried vegetables. Vegetables in a cheese sauce. Regular canned vegetables. Regular canned tomato sauce and paste. Regular tomato and vegetable juices. °Fruits °Dried fruits. Canned fruit in light or heavy syrup. Fruit juice. °Meat and Other Protein Products °Fatty cuts of meat. Ribs, chicken wings, bacon, sausage, bologna, salami, chitterlings, fatback, hot dogs, bratwurst, and packaged luncheon meats. Salted nuts and seeds. Canned beans with salt. °Dairy °Whole or 2% milk, cream, half-and-half, and cream cheese. Whole-fat or sweetened yogurt. Full-fat   cheeses or blue cheese. Nondairy creamers and whipped toppings. Processed cheese, cheese spreads, or cheese  curds. °Condiments °Onion and garlic salt, seasoned salt, table salt, and sea salt. Canned and packaged gravies. Worcestershire sauce. Tartar sauce. Barbecue sauce. Teriyaki sauce. Soy sauce, including reduced sodium. Steak sauce. Fish sauce. Oyster sauce. Cocktail sauce. Horseradish. Ketchup and mustard. Meat flavorings and tenderizers. Bouillon cubes. Hot sauce. Tabasco sauce. Marinades. Taco seasonings. Relishes. °Fats and Oils °Butter, stick margarine, lard, shortening, ghee, and bacon fat. Coconut, palm kernel, or palm oils. Regular salad dressings. °Other °Pickles and olives. Salted popcorn and pretzels. °The items listed above may not be a complete list of foods and beverages to avoid. Contact your dietitian for more information. °WHERE CAN I FIND MORE INFORMATION? °[employer], [employer], [employer]: www.nhlbi.nih.gov/health/health-topics/topics/dash/ °Document Released: 03/10/2011 Document Revised: 08/05/2013 Document Reviewed: 01/23/2013 °ExitCare® Patient Information ©2015 [employer]. This information is not intended to replace advice given to you by your health care provider. Make sure you discuss any questions you have with your health care provider. ° °

## 2014-08-19 NOTE — Progress Notes (Signed)
Patient here for follow up on her hypertension Patient does not need refills at this time

## 2014-08-19 NOTE — Progress Notes (Signed)
MRN: [id number] Name: [name]  Sex: female Age: 44 y.o. DOB: 10-16-1970  Allergies: Review of patient's allergies indicates no known allergies.  Chief Complaint  Patient presents with  . Follow-up    HPI: Patient is 44 y.o. female who has history of hypertension comes today for followup as per patient she is taking her medication, her blood pressure is improved compared last visit, she denies any headache dizziness chest and shortness of breath, she has history of asthma sometimes she gets wheezing at night today she is requesting prescription for albuterol to use this when necessary, she denies smoking cigarettes.  Past Medical History  Diagnosis Date  . Hypertension     Past Surgical History  Procedure Laterality Date  . Cholecystectomy    . Left eye surgery         Medication List       This list is accurate as of: 08/19/14 11:17 AM.  Always use your most recent med list.               albuterol 108 (90 BASE) MCG/ACT inhaler  Commonly known as:  PROVENTIL HFA;VENTOLIN HFA  Inhale 2 puffs into the lungs every 6 (six) hours as needed for wheezing or shortness of breath.     HYDROcodone-acetaminophen 5-325 MG per tablet  Commonly known as:  NORCO/VICODIN  Take 1 tablet by mouth every 4 (four) hours as needed for moderate pain.     ibuprofen 200 MG tablet  Commonly known as:  ADVIL,MOTRIN  Take 200-400 mg by mouth every 6 (six) hours as needed for mild pain or moderate pain.     lisinopril-hydrochlorothiazide 10-12.5 MG per tablet  Commonly known as:  PRINZIDE,ZESTORETIC  Take 1 tablet by mouth daily.        Meds ordered this encounter  Medications  . albuterol (PROVENTIL HFA;VENTOLIN HFA) 108 (90 BASE) MCG/ACT inhaler    Sig: Inhale 2 puffs into the lungs every 6 (six) hours as needed for wheezing or shortness of breath.    Dispense:  1 Inhaler    Refill:  2     There is no immunization history on file for this patient.  Family History    Problem Relation Age of Onset  . Heart disease Father   . Hypertension Mother   . Diabetes Maternal Grandfather     History  Substance Use Topics  . Smoking status: Never Smoker   . Smokeless tobacco: Not on file  . Alcohol Use: No    Review of Systems   As noted in HPI  Filed Vitals:   08/19/14 1017  BP: 132/92  Pulse: 85  Temp: 98 F (36.7 C)  Resp: 16    Physical Exam  Physical Exam  Constitutional: No distress.  Eyes: EOM are normal. Pupils are equal, round, and reactive to light.  Cardiovascular: Normal rate and regular rhythm.   Pulmonary/Chest: Breath sounds normal. No respiratory distress. She has no wheezes. She has no rales.  Musculoskeletal: She exhibits no edema.    CBC    Component Value Date/Time   WBC 7.0 04/30/2014 1103   RBC 4.40 04/30/2014 1103   HGB 13.5 04/30/2014 1103   HCT 39.9 04/30/2014 1103   PLT 283 04/30/2014 1103   MCV 90.7 04/30/2014 1103   LYMPHSABS 1.3 04/30/2014 1103   MONOABS 0.5 04/30/2014 1103   EOSABS 0.4 04/30/2014 1103   BASOSABS 0.1 04/30/2014 1103    CMP     Component Value Date/Time  NA 138 04/30/2014 1103   K 4.7 04/30/2014 1103   CL 104 04/30/2014 1103   CO2 26 04/30/2014 1103   GLUCOSE 84 04/30/2014 1103   BUN 19 04/30/2014 1103   CREATININE 0.63 04/30/2014 1103   CREATININE 0.67 06/22/2013 1348   CALCIUM 9.1 04/30/2014 1103   PROT 7.1 04/30/2014 1103   ALBUMIN 4.0 04/30/2014 1103   AST 16 04/30/2014 1103   ALT 11 04/30/2014 1103   ALKPHOS 56 04/30/2014 1103   BILITOT 0.5 04/30/2014 1103   GFRNONAA >89 04/30/2014 1103   GFRNONAA >90 06/22/2013 1348   GFRAA >89 04/30/2014 1103   GFRAA >90 06/22/2013 1348    Lab Results  Component Value Date/Time   CHOL 176 04/30/2014 11:03 AM    No results found for: HGBA1C  Lab Results  Component Value Date/Time   AST 16 04/30/2014 11:03 AM    Assessment and Plan  Encounter for screening mammogram for breast cancer - Plan: MM DIGITAL SCREENING  BILATERAL  Essential hypertension - Plan: this patient for DASH diet continue with current meds, repeat blood chemistry COMPLETE METABOLIC PANEL WITH GFR  History of asthma - Plan: albuterol (PROVENTIL HFA;VENTOLIN HFA) 108 (90 BASE) MCG/ACT inhaler   Health Maintenance  -Mammogram:ordered   Return in about 4 months (around 12/20/2014) for hypertension.   This note has been created with Surveyor, quantity. Any transcriptional errors are unintentional.    Lorayne Marek, MD

## 2014-08-21 ENCOUNTER — Telehealth: Payer: Self-pay

## 2014-08-21 NOTE — Telephone Encounter (Signed)
-----   Message from Lorayne Marek, MD sent at 08/20/2014 12:27 PM EDT ----- Call and let the Patient know that blood work is normal.

## 2014-08-21 NOTE — Telephone Encounter (Signed)
Patient not available Left message on voice mail to return our call 

## 2014-08-21 NOTE — Telephone Encounter (Signed)
Patient returned phone call and she is aware of her lab results

## 2014-09-17 ENCOUNTER — Ambulatory Visit (HOSPITAL_COMMUNITY)
Admission: RE | Admit: 2014-09-17 | Discharge: 2014-09-17 | Disposition: A | Discharge status: Home / Self Care | Payer: Medicare Other | Source: Ambulatory Visit | Attending: Internal Medicine | Admitting: Internal Medicine

## 2014-09-17 DIAGNOSIS — Z1231 Encounter for screening mammogram for malignant neoplasm of breast: Secondary | ICD-10-CM | POA: Insufficient documentation

## 2015-01-05 ENCOUNTER — Ambulatory Visit: Payer: Medicare Other | Attending: Internal Medicine | Admitting: Internal Medicine

## 2015-01-05 ENCOUNTER — Encounter: Payer: Self-pay | Admitting: Internal Medicine

## 2015-01-05 VITALS — BP 134/88 | HR 83 | Temp 98.0°F | Resp 16 | Ht 65.0 in | Wt 156.2 lb

## 2015-01-05 DIAGNOSIS — I10 Essential (primary) hypertension: Secondary | ICD-10-CM | POA: Diagnosis not present

## 2015-01-05 DIAGNOSIS — Z23 Encounter for immunization: Secondary | ICD-10-CM | POA: Insufficient documentation

## 2015-01-05 DIAGNOSIS — Z8709 Personal history of other diseases of the respiratory system: Secondary | ICD-10-CM | POA: Insufficient documentation

## 2015-01-05 LAB — BASIC METABOLIC PANEL
BUN: 19 mg/dL (ref 7–25)
CO2: 26 mmol/L (ref 20–31)
Calcium: 9.8 mg/dL (ref 8.6–10.2)
Chloride: 105 mmol/L (ref 98–110)
Creat: 0.83 mg/dL (ref 0.50–1.10)
Glucose, Bld: 99 mg/dL (ref 65–99)
POTASSIUM: 4.8 mmol/L (ref 3.5–5.3)
SODIUM: 140 mmol/L (ref 135–146)

## 2015-01-05 MED ORDER — ALBUTEROL SULFATE HFA 108 (90 BASE) MCG/ACT IN AERS: 2.0000 | INHALATION_SPRAY | Freq: Four times a day (QID) | RESPIRATORY_TRACT | Status: DC | PRN

## 2015-01-05 MED ORDER — LISINOPRIL-HYDROCHLOROTHIAZIDE 10-12.5 MG PO TABS: 1.0000 | ORAL_TABLET | Freq: Every day | ORAL | Status: DC

## 2015-01-05 NOTE — Patient Instructions (Signed)

## 2015-01-05 NOTE — Progress Notes (Signed)
Patient here for follow up on her HTN And for medication refills

## 2015-01-05 NOTE — Progress Notes (Signed)
Patient ID: [name], female   DOB: 03-15-1971, 44 y.o.   MRN: [id number] Subjective:  [name] is a 44 y.o. female with hypertension and asthma. Current Outpatient Prescriptions  Medication Sig Dispense Refill  . albuterol (PROVENTIL HFA;VENTOLIN HFA) 108 (90 BASE) MCG/ACT inhaler Inhale 2 puffs into the lungs every 6 (six) hours as needed for wheezing or shortness of breath. 1 Inhaler 2  . ibuprofen (ADVIL,MOTRIN) 200 MG tablet Take 200-400 mg by mouth every 6 (six) hours as needed for mild pain or moderate pain.    Marland Kitchen lisinopril-hydrochlorothiazide (PRINZIDE,ZESTORETIC) 10-12.5 MG per tablet Take 1 tablet by mouth daily. 90 tablet 3  . HYDROcodone-acetaminophen (NORCO/VICODIN) 5-325 MG per tablet Take 1 tablet by mouth every 4 (four) hours as needed for moderate pain. (Patient not taking: Reported on 05/08/2014) 15 tablet 0   No current facility-administered medications for this visit.    Hypertension ROS: taking medications as instructed, no medication side effects noted, no TIA's, no chest pain on exertion, no dyspnea on exertion, no swelling of ankles and no palpitations.  New concerns: She has had her mammogram and pap smear within the past few months.   Objective:  BP 134/88 mmHg  Pulse 83  Temp(Src) 98 F (36.7 C)  Resp 16  Ht 5\' 5"  (1.651 m)  Wt 156 lb 3.2 oz (70.852 kg)  BMI 25.99 kg/m2  SpO2 100%  Appearance alert, well appearing, and in no distress, oriented to person, place, and time and overweight. General exam BP noted to be well controlled today in office, S1, S2 normal, no gallop, no murmur, chest clear, no JVD, no HSM, no edema.  Lab review: labs reviewed, orders written for new lab studies as appropriate; see orders.   Assessment:   Hypertension well controlled and needs to follow diet more regularly.  History of asthma: albuterol inhaler refilled. Stable Flu shot received  Plan:  Current treatment plan is effective, no change in therapy. Reviewed  diet, exercise and weight control. Recommended sodium restriction.   Return in about 6 months (around 07/06/2015) for Hypertension.    Lance Bosch, NP 01/05/2015 2:36 PM

## 2015-01-08 ENCOUNTER — Telehealth: Payer: Self-pay

## 2015-01-08 NOTE — Telephone Encounter (Signed)
-----   Message from Lance Bosch, NP sent at 01/07/2015  9:28 PM EDT ----- Labs are within normal limits

## 2015-01-08 NOTE — Telephone Encounter (Signed)
Patient returned phone call. Please f/u  °

## 2015-01-08 NOTE — Telephone Encounter (Signed)
Patient not available Left message on voice mail to return our call 

## 2015-01-09 ENCOUNTER — Telehealth: Payer: Self-pay

## 2015-01-09 NOTE — Telephone Encounter (Signed)
Returned patient phone call Patient not available Left message on voice mail to return our call 

## 2015-01-09 NOTE — Telephone Encounter (Signed)
Patient returned phone call  And she is aware of her normal results

## 2015-07-06 ENCOUNTER — Ambulatory Visit: Payer: Medicare Other | Admitting: Internal Medicine

## 2015-07-14 ENCOUNTER — Ambulatory Visit: Payer: Medicare Other | Admitting: Internal Medicine

## 2015-11-23 ENCOUNTER — Encounter: Payer: Self-pay | Admitting: Internal Medicine

## 2015-11-23 ENCOUNTER — Encounter (INDEPENDENT_AMBULATORY_CARE_PROVIDER_SITE_OTHER): Payer: Self-pay

## 2015-11-23 ENCOUNTER — Ambulatory Visit: Payer: BLUE CROSS/BLUE SHIELD | Attending: Internal Medicine | Admitting: Internal Medicine

## 2015-11-23 VITALS — BP 121/85 | HR 79 | Temp 98.6°F | Resp 16 | Wt 157.4 lb

## 2015-11-23 DIAGNOSIS — J45909 Unspecified asthma, uncomplicated: Secondary | ICD-10-CM | POA: Insufficient documentation

## 2015-11-23 DIAGNOSIS — Z8709 Personal history of other diseases of the respiratory system: Secondary | ICD-10-CM | POA: Diagnosis not present

## 2015-11-23 DIAGNOSIS — Z23 Encounter for immunization: Secondary | ICD-10-CM | POA: Diagnosis not present

## 2015-11-23 DIAGNOSIS — I10 Essential (primary) hypertension: Secondary | ICD-10-CM | POA: Diagnosis present

## 2015-11-23 DIAGNOSIS — Z114 Encounter for screening for human immunodeficiency virus [HIV]: Secondary | ICD-10-CM

## 2015-11-23 LAB — BASIC METABOLIC PANEL WITH GFR
BUN: 23 mg/dL (ref 7–25)
CHLORIDE: 109 mmol/L (ref 98–110)
CO2: 26 mmol/L (ref 20–31)
Calcium: 9.9 mg/dL (ref 8.6–10.2)
Creat: 1.05 mg/dL (ref 0.50–1.10)
GFR, Est African American: 75 mL/min (ref >=60)
GFR, Est Non African American: 65 mL/min (ref >=60)
Glucose, Bld: 96 mg/dL (ref 65–99)
POTASSIUM: 4.4 mmol/L (ref 3.5–5.3)
Sodium: 144 mmol/L (ref 135–146)

## 2015-11-23 LAB — CBC WITH DIFFERENTIAL/PLATELET
BASOS PCT: 1 %
Basophils Absolute: 73 cells/uL (ref 0–200)
Eosinophils Absolute: 511 cells/uL — ABNORMAL HIGH (ref 15–500)
Eosinophils Relative: 7 %
HCT: 39.3 % (ref 35.0–45.0)
HEMOGLOBIN: 13.1 g/dL (ref 11.7–15.5)
LYMPHS ABS: 2263 {cells}/uL (ref 850–3900)
Lymphocytes Relative: 31 %
MCH: 29.6 pg (ref 27.0–33.0)
MCHC: 33.3 g/dL (ref 32.0–36.0)
MCV: 88.7 fL (ref 80.0–100.0)
MONO ABS: 511 {cells}/uL (ref 200–950)
MPV: 9.8 fL (ref 7.5–12.5)
Monocytes Relative: 7 %
NEUTROS PCT: 54 %
Neutro Abs: 3942 cells/uL (ref 1500–7800)
Platelets: 336 10*3/uL (ref 140–400)
RBC: 4.43 MIL/uL (ref 3.80–5.10)
RDW: 15.5 % — AB (ref 11.0–15.0)
WBC: 7.3 10*3/uL (ref 3.8–10.8)

## 2015-11-23 LAB — HIV ANTIBODY (ROUTINE TESTING W REFLEX): HIV 1&2 Ab, 4th Generation: NONREACTIVE

## 2015-11-23 MED ORDER — LISINOPRIL-HYDROCHLOROTHIAZIDE 10-12.5 MG PO TABS: 1.0000 | ORAL_TABLET | Freq: Every day | ORAL | 3 refills | Status: DC

## 2015-11-23 MED ORDER — ALBUTEROL SULFATE HFA 108 (90 BASE) MCG/ACT IN AERS: 2.0000 | INHALATION_SPRAY | Freq: Four times a day (QID) | RESPIRATORY_TRACT | 2 refills | Status: DC | PRN

## 2015-11-23 NOTE — Patient Instructions (Addendum)
Influenza, Adult Influenza ("the flu") is a viral infection of the respiratory tract. It occurs more often in winter months because people spend more time in close contact with one another. Influenza can make you feel very sick. Influenza easily spreads from person to person (contagious). CAUSES  Influenza is caused by a virus that infects the respiratory tract. You can catch the virus by breathing in droplets from an infected person's cough or sneeze. You can also catch the virus by touching something that was recently contaminated with the virus and then touching your mouth, nose, or eyes. RISKS AND COMPLICATIONS You may be at risk for a more severe case of influenza if you smoke cigarettes, have diabetes, have chronic heart disease (such as heart failure) or lung disease (such as asthma), or if you have a weakened immune system. Elderly people and pregnant women are also at risk for more serious infections. The most common problem of influenza is a lung infection (pneumonia). Sometimes, this problem can require emergency medical care and may be life threatening. SIGNS AND SYMPTOMS  Symptoms typically last 4 to 10 days and may include:  Fever.  Chills.  Headache, body aches, and muscle aches.  Sore throat.  Chest discomfort and cough.  Poor appetite.  Weakness or feeling tired.  Dizziness.  Nausea or vomiting. DIAGNOSIS  Diagnosis of influenza is often made based on your history and a physical exam. A nose or throat swab test can be done to confirm the diagnosis. TREATMENT  In mild cases, influenza goes away on its own. Treatment is directed at relieving symptoms. For more severe cases, your health care provider may prescribe antiviral medicines to shorten the sickness. Antibiotic medicines are not effective because the infection is caused by a virus, not by bacteria. HOME CARE INSTRUCTIONS  Take medicines only as directed by your health care provider.  Use a cool mist humidifier  to make breathing easier.  Get plenty of rest until your temperature returns to normal. This usually takes 3 to 4 days.  Drink enough fluid to keep your urine clear or pale yellow.  Cover yourmouth and nosewhen coughing or sneezing,and wash your handswellto prevent thevirusfrom spreading.  Stay homefromwork orschool untilthe fever is gonefor at least 34full day. PREVENTION  An annual influenza vaccination (flu shot) is the best way to avoid getting influenza. An annual flu shot is now routinely recommended for all adults in the [place] IF:  You experiencechest pain, yourcough worsens,or you producemore mucus.  Youhave nausea,vomiting, ordiarrhea.  Your fever returns or gets worse. SEEK IMMEDIATE MEDICAL CARE IF:  You havetrouble breathing, you become short of breath,or your skin ornails becomebluish.  You have severe painor stiffnessin the neck.  You develop a sudden headache, or pain in the face or ear.  You have nausea or vomiting that you cannot control. MAKE SURE YOU:   Understand these instructions.  Will watch your condition.  Will get help right away if you are not doing well or get worse.   This information is not intended to replace advice given to you by your health care provider. Make sure you discuss any questions you have with your health care provider.   Document Released: 03/18/2000 Document Revised: 04/11/2014 Document Reviewed: 06/20/2011 Elsevier Interactive Patient Education 2016 Elsevier Inc.  -  Hypertension Hypertension is another name for high blood pressure. High blood pressure forces your heart to work harder to pump blood. A blood pressure reading has two numbers, which includes a higher  number over a lower number (example: 110/72). HOME CARE   Have your blood pressure rechecked by your doctor.  Only take medicine as told by your doctor. Follow the directions carefully. The medicine does not work as  well if you skip doses. Skipping doses also puts you at risk for problems.  Do not smoke.  Monitor your blood pressure at home as told by your doctor. GET HELP IF:  You think you are having a reaction to the medicine you are taking.  You have repeat headaches or feel dizzy.  You have puffiness (swelling) in your ankles.  You have trouble with your vision. GET HELP RIGHT AWAY IF:   You get a very bad headache and are confused.  You feel weak, numb, or faint.  You get chest or belly (abdominal) pain.  You throw up (vomit).  You cannot breathe very well. MAKE SURE YOU:   Understand these instructions.  Will watch your condition.  Will get help right away if you are not doing well or get worse.   This information is not intended to replace advice given to you by your health care provider. Make sure you discuss any questions you have with your health care provider.   Document Released: 09/07/2007 Document Revised: 03/26/2013 Document Reviewed: 01/11/2013 Elsevier Interactive Patient Education 2016 [place] DASH stands for "Dietary Approaches to Stop Hypertension." The DASH eating plan is a healthy eating plan that has been shown to reduce high blood pressure (hypertension). Additional health benefits may include reducing the risk of type 2 diabetes mellitus, heart disease, and stroke. The DASH eating plan may also help with weight loss. WHAT DO I NEED TO KNOW ABOUT THE DASH EATING PLAN? For the DASH eating plan, you will follow these general guidelines:  Choose foods with a percent daily value for sodium of less than 5% (as listed on the food label).  Use salt-free seasonings or herbs instead of table salt or sea salt.  Check with your health care provider or pharmacist before using salt substitutes.  Eat lower-sodium products, often labeled as "lower sodium" or "no salt added."  Eat fresh foods.  Eat more vegetables, fruits, and low-fat  dairy products.  Choose whole grains. Look for the word "whole" as the first word in the ingredient list.  Choose fish and skinless chicken or [place] more often than red meat. Limit fish, poultry, and meat to 6 oz (170 g) each day.  Limit sweets, desserts, sugars, and sugary drinks.  Choose heart-healthy fats.  Limit cheese to 1 oz (28 g) per day.  Eat more home-cooked food and less restaurant, buffet, and fast food.  Limit fried foods.  Cook foods using methods other than frying.  Limit canned vegetables. If you do use them, rinse them well to decrease the sodium.  When eating at a restaurant, ask that your food be prepared with less salt, or no salt if possible. WHAT FOODS CAN I EAT? Seek help from a dietitian for individual calorie needs. Grains Whole grain or whole wheat bread. Brown rice. Whole grain or whole wheat pasta. Quinoa, bulgur, and whole grain cereals. Low-sodium cereals. Corn or whole wheat flour tortillas. Whole grain cornbread. Whole grain crackers. Low-sodium crackers. Vegetables Fresh or frozen vegetables (raw, steamed, roasted, or grilled). Low-sodium or reduced-sodium tomato and vegetable juices. Low-sodium or reduced-sodium tomato sauce and paste. Low-sodium or reduced-sodium canned vegetables.  Fruits All fresh, canned (in natural juice), or frozen fruits. Meat and  Other Protein Products Ground beef (85% or leaner), grass-fed beef, or beef trimmed of fat. Skinless chicken or [place]. Ground chicken or [place]. Pork trimmed of fat. All fish and seafood. Eggs. Dried beans, peas, or lentils. Unsalted nuts and seeds. Unsalted canned beans. Dairy Low-fat dairy products, such as skim or 1% milk, 2% or reduced-fat cheeses, low-fat ricotta or cottage cheese, or plain low-fat yogurt. Low-sodium or reduced-sodium cheeses. Fats and Oils Tub margarines without trans fats. Light or reduced-fat mayonnaise and salad dressings (reduced sodium). Avocado. Safflower, olive, or  canola oils. Natural peanut or almond butter. Other Unsalted popcorn and pretzels. The items listed above may not be a complete list of recommended foods or beverages. Contact your dietitian for more options. WHAT FOODS ARE NOT RECOMMENDED? Grains White bread. White pasta. White rice. Refined cornbread. Bagels and croissants. Crackers that contain trans fat. Vegetables Creamed or fried vegetables. Vegetables in a cheese sauce. Regular canned vegetables. Regular canned tomato sauce and paste. Regular tomato and vegetable juices. Fruits Dried fruits. Canned fruit in light or heavy syrup. Fruit juice. Meat and Other Protein Products Fatty cuts of meat. Ribs, chicken wings, bacon, sausage, bologna, salami, chitterlings, fatback, hot dogs, bratwurst, and packaged luncheon meats. Salted nuts and seeds. Canned beans with salt. Dairy Whole or 2% milk, cream, half-and-half, and cream cheese. Whole-fat or sweetened yogurt. Full-fat cheeses or blue cheese. Nondairy creamers and whipped toppings. Processed cheese, cheese spreads, or cheese curds. Condiments Onion and garlic salt, seasoned salt, table salt, and sea salt. Canned and packaged gravies. Worcestershire sauce. Tartar sauce. Barbecue sauce. Teriyaki sauce. Soy sauce, including reduced sodium. Steak sauce. Fish sauce. Oyster sauce. Cocktail sauce. Horseradish. Ketchup and mustard. Meat flavorings and tenderizers. Bouillon cubes. Hot sauce. Tabasco sauce. Marinades. Taco seasonings. Relishes. Fats and Oils Butter, stick margarine, lard, shortening, ghee, and bacon fat. Coconut, palm kernel, or palm oils. Regular salad dressings. Other Pickles and olives. Salted popcorn and pretzels. The items listed above may not be a complete list of foods and beverages to avoid. Contact your dietitian for more information. WHERE CAN I FIND MORE INFORMATION? National Heart, Lung, and Blood Institute: travelstabloid.com   This  information is not intended to replace advice given to you by your health care provider. Make sure you discuss any questions you have with your health care provider.   Document Released: 03/10/2011 Document Revised: 04/11/2014 Document Reviewed: 01/23/2013 Elsevier Interactive Patient Education [employer].

## 2015-11-23 NOTE — Progress Notes (Signed)
[name], is a 45 y.o. female  [id number]  [id number]  DOB - December 22, 1970  CC:  Chief Complaint  Patient presents with  . Establish Care  . Hypertension       HPI: [name] is a 45 y.o. female here today to establish medical care for f/u of htn, last seen in clinic 10/16. She is doing well, no c/o, eats healthy, avoids salty foods.  Does not smoke, drinks etoh rarely.  Has hx of childhood asthma, not around many smokers. Per pt, occasionally in afternoon, may need to use her proair qod to help breathing. denies sob/cough.  Patient has No headache, No chest pain, No abdominal pain - No Nausea, No new weakness tingling or numbness, No Cough - SOB.    Review of Systems: Per HPI, o/w all systems reviewed and negative.    No Known Allergies Past Medical History:  Diagnosis Date  . Hypertension    Current Outpatient Prescriptions on File Prior to Visit  Medication Sig Dispense Refill  . ibuprofen (ADVIL,MOTRIN) 200 MG tablet Take 200-400 mg by mouth every 6 (six) hours as needed for mild pain or moderate pain.     No current facility-administered medications on file prior to visit.    Family History  Problem Relation Age of Onset  . Heart disease Father   . Hypertension Mother   . Diabetes Maternal Grandfather    Social History   Social History  . Marital status: Married    Spouse name: N/A  . Number of children: N/A  . Years of education: N/A   Occupational History  . Not on file.   Social History Main Topics  . Smoking status: Never Smoker  . Smokeless tobacco: Not on file  . Alcohol use No  . Drug use: No  . Sexual activity: Not on file   Other Topics Concern  . Not on file   Social History Narrative  . No narrative on file    Objective:   Vitals:   11/23/15 1201  BP: 121/85  Pulse: 79  Resp: 16  Temp: 98.6 F (37 C)    Filed Weights   11/23/15 1201  Weight: 157 lb 6.4 oz (71.4 kg)    BP Readings  from Last 3 Encounters:  11/23/15 121/85  01/05/15 134/88  08/19/14 (!) 132/92    Physical Exam: Constitutional: Patient appears well-developed and well-nourished. No distress. AAOx3, pleasant HENT: Normocephalic, atraumatic, External right and left ear normal. Oropharynx is clear and moist.  bilat tms clear Eyes: Conjunctivae and EOM are normal. PERRL, no scleral icterus. Neck: Normal ROM. Neck supple. No JVD.  CVS: RRR, S1/S2 +, no murmurs, no gallops, no carotid bruit.  Pulmonary: Effort and breath sounds normal, no stridor, rhonchi, wheezes, rales.  Abdominal: Soft. BS +, no distension, tenderness, rebound or guarding.  Musculoskeletal: Normal range of motion. No edema and no tenderness.  LE: bilat/ no c/c/e, pulses 2+ bilateral. Neuro: Alert. , muscle tone coordination wnl. No cranial nerve deficit grossly., 2+ reflexes bilat knees Skin: Skin is warm and dry. No rash noted. Not diaphoretic. No erythema. No pallor. Psychiatric: Normal mood and affect. Behavior, judgment, thought content normal.  Lab Results  Component Value Date   WBC 7.0 04/30/2014   HGB 13.5 04/30/2014   HCT 39.9 04/30/2014   MCV 90.7 04/30/2014   PLT 283 04/30/2014   Lab Results  Component Value Date   CREATININE 0.83 01/05/2015   BUN 19 01/05/2015   NA  140 01/05/2015   K 4.8 01/05/2015   CL 105 01/05/2015   CO2 26 01/05/2015    No results found for: HGBA1C Lipid Panel     Component Value Date/Time   CHOL 176 04/30/2014 1103   TRIG 96 04/30/2014 1103   HDL 56 04/30/2014 1103   CHOLHDL 3.1 04/30/2014 1103   VLDL 19 04/30/2014 1103   LDLCALC 101 (H) 04/30/2014 1103       Depression screen [facility] 2/9 11/23/2015 05/08/2014 04/30/2014  Decreased Interest 0 0 0  Down, Depressed, Hopeless 0 0 0  PHQ - 2 Score 0 0 0    Assessment and plan:   1. Essential hypertension Well controlled, continue low salt diet, increase exercise recd - lisinopril-hydrochlorothiazide (PRINZIDE,ZESTORETIC) 10-12.5 MG  tablet; Take 1 tablet by mouth daily.  Dispense: 90 tablet; Refill: 3 - BASIC METABOLIC PANEL WITH GFR - CBC with Differential/Platelet  2. History of asthma - reasonably controlled, no 2nd tob - albuterol (PROVENTIL HFA;VENTOLIN HFA) 108 (90 Base) MCG/ACT inhaler; Inhale 2 puffs into the lungs every 6 (six) hours as needed for wheezing or shortness of breath.  Dispense: 1 Inhaler; Refill: 2  3. Encounter for screening for HIV - HIV antibody (with reflex)  4. Flu vaccine today, declined TDAP   Return in about 6 months (around 05/25/2016), or if symptoms worsen or fail to improve.  The patient was given clear instructions to go to ER or return to medical center if symptoms don't improve, worsen or new problems develop. The patient verbalized understanding. The patient was told to call to get lab results if they haven't heard anything in the next week.    This note has been created with Surveyor, quantity. Any transcriptional errors are unintentional.   Maren Reamer, MD, [place], [place]   11/23/2015, 12:56 PM

## 2015-11-23 NOTE — Progress Notes (Signed)
Pt is in the office today for establish care and hypertension Pt states she is not in any pain  Pt states she doesn't have any difficulty taking medication

## 2015-11-25 ENCOUNTER — Telehealth: Payer: Self-pay

## 2015-11-25 NOTE — Telephone Encounter (Signed)
Contacted pt to go over lab results pt did not answer lvm telling pt to give me a call at their earliest convenience

## 2015-11-26 ENCOUNTER — Telehealth: Payer: Self-pay

## 2015-11-26 NOTE — Telephone Encounter (Signed)
Contacted pt to go over lab results pt is aware and doesn't have any questions or concerns 

## 2016-03-25 ENCOUNTER — Telehealth: Payer: Self-pay | Admitting: Internal Medicine

## 2016-03-25 DIAGNOSIS — Z8709 Personal history of other diseases of the respiratory system: Secondary | ICD-10-CM

## 2016-03-25 MED ORDER — ALBUTEROL SULFATE HFA 108 (90 BASE) MCG/ACT IN AERS: 2.0000 | INHALATION_SPRAY | Freq: Four times a day (QID) | RESPIRATORY_TRACT | 2 refills | Status: DC | PRN

## 2016-03-25 NOTE — Telephone Encounter (Signed)
Pt requesting albuterol refill, please send to [facility] on [address].

## 2016-03-25 NOTE — Telephone Encounter (Signed)
-   Albuterol refilled.

## 2016-06-07 ENCOUNTER — Ambulatory Visit: Payer: 59 | Attending: Internal Medicine | Admitting: Internal Medicine

## 2016-06-07 VITALS — BP 123/88 | HR 78 | Temp 98.3°F | Resp 16 | Wt 152.0 lb

## 2016-06-07 DIAGNOSIS — Z23 Encounter for immunization: Secondary | ICD-10-CM

## 2016-06-07 DIAGNOSIS — Z131 Encounter for screening for diabetes mellitus: Secondary | ICD-10-CM | POA: Diagnosis not present

## 2016-06-07 DIAGNOSIS — Z8709 Personal history of other diseases of the respiratory system: Secondary | ICD-10-CM

## 2016-06-07 DIAGNOSIS — Z1329 Encounter for screening for other suspected endocrine disorder: Secondary | ICD-10-CM | POA: Diagnosis not present

## 2016-06-07 DIAGNOSIS — J45909 Unspecified asthma, uncomplicated: Secondary | ICD-10-CM | POA: Insufficient documentation

## 2016-06-07 DIAGNOSIS — I10 Essential (primary) hypertension: Secondary | ICD-10-CM

## 2016-06-07 LAB — CMP AND LIVER
ALT: 12 U/L (ref 6–29)
AST: 17 U/L (ref 10–30)
Albumin: 4 g/dL (ref 3.6–5.1)
Alkaline Phosphatase: 54 U/L (ref 33–115)
BILIRUBIN DIRECT: 0.1 mg/dL (ref <=0.2)
BILIRUBIN TOTAL: 0.2 mg/dL (ref 0.2–1.2)
BUN: 20 mg/dL (ref 7–25)
CO2: 22 mmol/L (ref 20–31)
CREATININE: 0.75 mg/dL (ref 0.50–1.10)
Calcium: 9.4 mg/dL (ref 8.6–10.2)
Chloride: 107 mmol/L (ref 98–110)
Glucose, Bld: 107 mg/dL — ABNORMAL HIGH (ref 65–99)
Indirect Bilirubin: 0.1 mg/dL — ABNORMAL LOW (ref 0.2–1.2)
Potassium: 4.2 mmol/L (ref 3.5–5.3)
Sodium: 140 mmol/L (ref 135–146)
Total Protein: 7 g/dL (ref 6.1–8.1)

## 2016-06-07 LAB — CBC WITH DIFFERENTIAL/PLATELET
Basophils Absolute: 0 cells/uL (ref 0–200)
Basophils Relative: 0 %
EOS ABS: 498 {cells}/uL (ref 15–500)
Eosinophils Relative: 6 %
HEMATOCRIT: 38.2 % (ref 35.0–45.0)
Hemoglobin: 12.8 g/dL (ref 11.7–15.5)
Lymphocytes Relative: 29 %
Lymphs Abs: 2407 cells/uL (ref 850–3900)
MCH: 30.7 pg (ref 27.0–33.0)
MCHC: 33.5 g/dL (ref 32.0–36.0)
MCV: 91.6 fL (ref 80.0–100.0)
MONO ABS: 498 {cells}/uL (ref 200–950)
MPV: 10 fL (ref 7.5–12.5)
Monocytes Relative: 6 %
NEUTROS ABS: 4897 {cells}/uL (ref 1500–7800)
Neutrophils Relative %: 59 %
Platelets: 278 10*3/uL (ref 140–400)
RBC: 4.17 MIL/uL (ref 3.80–5.10)
RDW: 15.1 % — ABNORMAL HIGH (ref 11.0–15.0)
WBC: 8.3 10*3/uL (ref 3.8–10.8)

## 2016-06-07 LAB — LIPID PANEL
Cholesterol: 166 mg/dL (ref <200)
HDL: 50 mg/dL — ABNORMAL LOW (ref >50)
LDL CALC: 102 mg/dL — AB (ref <100)
Total CHOL/HDL Ratio: 3.3 Ratio (ref <5.0)
Triglycerides: 71 mg/dL (ref <150)
VLDL: 14 mg/dL (ref <30)

## 2016-06-07 LAB — POCT GLYCOSYLATED HEMOGLOBIN (HGB A1C): Hemoglobin A1C: 5.3

## 2016-06-07 LAB — TSH: TSH: 1.49 m[IU]/L

## 2016-06-07 MED ORDER — ALBUTEROL SULFATE HFA 108 (90 BASE) MCG/ACT IN AERS: 2.0000 | INHALATION_SPRAY | Freq: Four times a day (QID) | RESPIRATORY_TRACT | 11 refills | Status: DC | PRN

## 2016-06-07 MED ORDER — LISINOPRIL-HYDROCHLOROTHIAZIDE 10-12.5 MG PO TABS: 1.0000 | ORAL_TABLET | Freq: Every day | ORAL | 3 refills | Status: DC

## 2016-06-07 NOTE — Patient Instructions (Addendum)
Low-Sodium Eating Plan Sodium, which is an element that makes up salt, helps you maintain a healthy balance of fluids in your body. Too much sodium can increase your blood pressure and cause fluid and waste to be held in your body. Your health care provider or dietitian may recommend following this plan if you have high blood pressure (hypertension), kidney disease, liver disease, or heart failure. Eating less sodium can help lower your blood pressure, reduce swelling, and protect your heart, liver, and kidneys. What are tips for following this plan? General guidelines   Most people on this plan should limit their sodium intake to 1,500-2,000 mg (milligrams) of sodium each day. Reading food labels   The Nutrition Facts label lists the amount of sodium in one serving of the food. If you eat more than one serving, you must multiply the listed amount of sodium by the number of servings.  Choose foods with less than 140 mg of sodium per serving.  Avoid foods with 300 mg of sodium or more per serving. Shopping   Look for lower-sodium products, often labeled as "low-sodium" or "no salt added."  Always check the sodium content even if foods are labeled as "unsalted" or "no salt added".  Buy fresh foods.  Avoid canned foods and premade or frozen meals.  Avoid canned, cured, or processed meats  Buy breads that have less than 80 mg of sodium per slice. Cooking   Eat more home-cooked food and less restaurant, buffet, and fast food.  Avoid adding salt when cooking. Use salt-free seasonings or herbs instead of table salt or sea salt. Check with your health care provider or pharmacist before using salt substitutes.  Cook with plant-based oils, such as canola, sunflower, or olive oil. Meal planning   When eating at a restaurant, ask that your food be prepared with less salt or no salt, if possible.  Avoid foods that contain MSG (monosodium glutamate). MSG is sometimes added to [place] food,  bouillon, and some canned foods. What foods are recommended? The items listed may not be a complete list. Talk with your dietitian about what dietary choices are best for you. Grains  Low-sodium cereals, including oats, puffed wheat and rice, and shredded wheat. Low-sodium crackers. Unsalted rice. Unsalted pasta. Low-sodium bread. Whole-grain breads and whole-grain pasta. Vegetables  Fresh or frozen vegetables. "No salt added" canned vegetables. "No salt added" tomato sauce and paste. Low-sodium or reduced-sodium tomato and vegetable juice. Fruits  Fresh, frozen, or canned fruit. Fruit juice. Meats and other protein foods  Fresh or frozen (no salt added) meat, poultry, seafood, and fish. Low-sodium canned tuna and salmon. Unsalted nuts. Dried peas, beans, and lentils without added salt. Unsalted canned beans. Eggs. Unsalted nut butters. Dairy  Milk. Soy milk. Cheese that is naturally low in sodium, such as ricotta cheese, fresh mozzarella, or Swiss cheese Low-sodium or reduced-sodium cheese. Cream cheese. Yogurt. Fats and oils  Unsalted butter. Unsalted margarine with no trans fat. Vegetable oils such as canola or olive oils. Seasonings and other foods  Fresh and dried herbs and spices. Salt-free seasonings. Low-sodium mustard and ketchup. Sodium-free salad dressing. Sodium-free light mayonnaise. Fresh or refrigerated horseradish. Lemon juice. Vinegar. Homemade, reduced-sodium, or low-sodium soups. Unsalted popcorn and pretzels. Low-salt or salt-free chips. What foods are not recommended? The items listed may not be a complete list. Talk with your dietitian about what dietary choices are best for you. Grains  Instant hot cereals. Bread stuffing, pancake, and biscuit mixes. Croutons. Seasoned rice or pasta  mixes. Noodle soup cups. Boxed or frozen macaroni and cheese. Regular salted crackers. Self-rising flour. Vegetables  Sauerkraut, pickled vegetables, and relishes. Olives. [place] fries. Onion  rings. Regular canned vegetables (not low-sodium or reduced-sodium). Regular canned tomato sauce and paste (not low-sodium or reduced-sodium). Regular tomato and vegetable juice (not low-sodium or reduced-sodium). Frozen vegetables in sauces. Meats and other protein foods  Meat or fish that is salted, canned, smoked, spiced, or pickled. Bacon, ham, sausage, hotdogs, corned beef, chipped beef, packaged lunch meats, salt pork, jerky, pickled herring, anchovies, regular canned tuna, sardines, salted nuts. Dairy  Processed cheese and cheese spreads. Cheese curds. Blue cheese. Feta cheese. String cheese. Regular cottage cheese. Buttermilk. Canned milk. Fats and oils  Salted butter. Regular margarine. Ghee. Bacon fat. Seasonings and other foods  Onion salt, garlic salt, seasoned salt, table salt, and sea salt. Canned and packaged gravies. Worcestershire sauce. Tartar sauce. Barbecue sauce. Teriyaki sauce. Soy sauce, including reduced-sodium. Steak sauce. Fish sauce. Oyster sauce. Cocktail sauce. Horseradish that you find on the shelf. Regular ketchup and mustard. Meat flavorings and tenderizers. Bouillon cubes. Hot sauce and Tabasco sauce. Premade or packaged marinades. Premade or packaged taco seasonings. Relishes. Regular salad dressings. Salsa. Potato and tortilla chips. Corn chips and puffs. Salted popcorn and pretzels. Canned or dried soups. Pizza. Frozen entrees and pot pies. Summary  Eating less sodium can help lower your blood pressure, reduce swelling, and protect your heart, liver, and kidneys.  Most people on this plan should limit their sodium intake to 1,500-2,000 mg (milligrams) of sodium each day.  Canned, boxed, and frozen foods are high in sodium. Restaurant foods, fast foods, and pizza are also very high in sodium. You also get sodium by adding salt to food.  Try to cook at home, eat more fresh fruits and vegetables, and eat less fast food, canned, processed, or prepared foods. This  information is not intended to replace advice given to you by your health care provider. Make sure you discuss any questions you have with your health care provider. Document Released: 09/10/2001 Document Revised: 03/14/2016 Document Reviewed: 03/14/2016 Elsevier Interactive Patient Education  2017 Elsevier Inc.  -  Hypertension Hypertension is another name for high blood pressure. High blood pressure forces your heart to work harder to pump blood. This can cause problems over time. There are two numbers in a blood pressure reading. There is a top number (systolic) over a bottom number (diastolic). It is best to have a blood pressure below 120/80. Healthy choices can help lower your blood pressure. You may need medicine to help lower your blood pressure if:  Your blood pressure cannot be lowered with healthy choices.  Your blood pressure is higher than 130/80. Follow these instructions at home: Eating and drinking   If directed, follow the DASH eating plan. This diet includes:  Filling half of your plate at each meal with fruits and vegetables.  Filling one quarter of your plate at each meal with whole grains. Whole grains include whole wheat pasta, brown rice, and whole grain bread.  Eating or drinking low-fat dairy products, such as skim milk or low-fat yogurt.  Filling one quarter of your plate at each meal with low-fat (lean) proteins. Low-fat proteins include fish, skinless chicken, eggs, beans, and tofu.  Avoiding fatty meat, cured and processed meat, or chicken with skin.  Avoiding premade or processed food.  Eat less than 1,500 mg of salt (sodium) a day.  Limit alcohol use to no more than 1 drink a  day for nonpregnant women and 2 drinks a day for men. One drink equals 12 oz of beer, 5 oz of wine, or 1 oz of hard liquor. Lifestyle   Work with your doctor to stay at a healthy weight or to lose weight. Ask your doctor what the best weight is for you.  Get at least 30  minutes of exercise that causes your heart to beat faster (aerobic exercise) most days of the week. This may include walking, swimming, or biking.  Get at least 30 minutes of exercise that strengthens your muscles (resistance exercise) at least 3 days a week. This may include lifting weights or pilates.  Do not use any products that contain nicotine or tobacco. This includes cigarettes and e-cigarettes. If you need help quitting, ask your doctor.  Check your blood pressure at home as told by your doctor.  Keep all follow-up visits as told by your doctor. This is important. Medicines   Take over-the-counter and prescription medicines only as told by your doctor. Follow directions carefully.  Do not skip doses of blood pressure medicine. The medicine does not work as well if you skip doses. Skipping doses also puts you at risk for problems.  Ask your doctor about side effects or reactions to medicines that you should watch for. Contact a doctor if:  You think you are having a reaction to the medicine you are taking.  You have headaches that keep coming back (recurring).  You feel dizzy.  You have swelling in your ankles.  You have trouble with your vision. Get help right away if:  You get a very bad headache.  You start to feel confused.  You feel weak or numb.  You feel faint.  You get very bad pain in your:  Chest.  Belly (abdomen).  You throw up (vomit) more than once.  You have trouble breathing. Summary  Hypertension is another name for high blood pressure.  Making healthy choices can help lower blood pressure. If your blood pressure cannot be controlled with healthy choices, you may need to take medicine. This information is not intended to replace advice given to you by your health care provider. Make sure you discuss any questions you have with your health care provider. Document Released: 09/07/2007 Document Revised: 02/17/2016 Document Reviewed:  02/17/2016 Elsevier Interactive Patient Education  2017 [place], Adult Asthma is a condition of the lungs in which the airways tighten and narrow. Asthma can make it hard to breathe. Asthma cannot be cured, but medicine and lifestyle changes can help control it. Asthma may be started (triggered) by:  Animal skin flakes (dander).  Dust.  Cockroaches.  Pollen.  Mold.  Smoke.  Cleaning products.  Hair sprays or aerosol sprays.  Paint fumes or strong smells.  Cold air, weather changes, and winds.  Crying or laughing hard.  Stress.  Certain medicines or drugs.  Foods, such as dried fruit, potato chips, and sparkling grape juice.  Infections or conditions (colds, flu).  Exercise.  Certain medical conditions or diseases.  Exercise or tiring activities. Follow these instructions at home:  Take medicine as told by your doctor.  Use a peak flow meter as told by your doctor. A peak flow meter is a tool that measures how well the lungs are working.  Record and keep track of the peak flow meter's readings.  Understand and use the asthma action plan. An asthma action plan is a written plan for taking care of  your asthma and treating your attacks.  To help prevent asthma attacks:  Do not smoke. Stay away from secondhand smoke.  Change your heating and air conditioning filter often.  Limit your use of fireplaces and wood stoves.  Get rid of pests (such as roaches and mice) and their droppings.  Throw away plants if you see mold on them.  Clean your floors. Dust regularly. Use cleaning products that do not smell.  Have someone vacuum when you are not home. Use a vacuum cleaner with a HEPA filter if possible.  Replace carpet with wood, tile, or vinyl flooring. Carpet can trap animal skin flakes and dust.  Use allergy-proof pillows, mattress covers, and box spring covers.  Wash bed sheets and blankets every week in hot water and dry them in a  dryer.  Use blankets that are made of polyester or cotton.  Clean bathrooms and kitchens with bleach. If possible, have someone repaint the walls in these rooms with mold-resistant paint. Keep out of the rooms that are being cleaned and painted.  Wash hands often. Contact a doctor if:  You have make a whistling sound when breaking (wheeze), have shortness of breath, or have a cough even if taking medicine to prevent attacks.  The colored mucus you cough up (sputum) is thicker than usual.  The colored mucus you cough up changes from clear or white to yellow, green, gray, or bloody.  You have problems from the medicine you are taking such as:  A rash.  Itching.  Swelling.  Trouble breathing.  You need reliever medicines more than 2-3 times a week.  Your peak flow measurement is still at 50-79% of your personal best after following the action plan for 1 hour.  You have a fever. Get help right away if:  You seem to be worse and are not responding to medicine during an asthma attack.  You are short of breath even at rest.  You get short of breath when doing very little activity.  You have trouble eating, drinking, or talking.  You have chest pain.  You have a fast heartbeat.  Your lips or fingernails start to turn blue.  You are light-headed, dizzy, or faint.  Your peak flow is less than 50% of your personal best. This information is not intended to replace advice given to you by your health care provider. Make sure you discuss any questions you have with your health care provider. Document Released: 09/07/2007 Document Revised: 08/27/2015 Document Reviewed: 10/18/2012 Elsevier Interactive Patient Education  2017 [place] (Tetanus and Diphtheria): What You Need to Know 1. Why get vaccinated? Tetanus  and diphtheria are very serious diseases. They are rare in the [place] today, but people who do become infected often have severe complications. Td  vaccine is used to protect adolescents and adults from both of these diseases. Both tetanus and diphtheria are infections caused by bacteria. Diphtheria spreads from person to person through coughing or sneezing. Tetanus-causing bacteria enter the body through cuts, scratches, or wounds. TETANUS (lockjaw) causes painful muscle tightening and stiffness, usually all over the body.  It can lead to tightening of muscles in the head and neck so you can't open your mouth, swallow, or sometimes even breathe. Tetanus kills about 1 out of every 10 people who are infected even after receiving the best medical care. DIPHTHERIA can cause a thick coating to form in the back of the throat.  It can lead to breathing problems, paralysis, heart failure,  and death. Before vaccines, as many as 200,000 cases of diphtheria and hundreds of cases of tetanus were reported in the [place] each year. Since vaccination began, reports of cases for both diseases have dropped by about 99%. 2. Td vaccine Td vaccine can protect adolescents and adults from tetanus and diphtheria. Td is usually given as a booster dose every 10 years but it can also be given earlier after a severe and dirty wound or burn. Another vaccine, called Tdap, which protects against pertussis in addition to tetanus and diphtheria, is sometimes recommended instead of Td vaccine. Your doctor or the person giving you the vaccine can give you more information. Td may safely be given at the same time as other vaccines. 3. Some people should not get this vaccine  A person who has ever had a life-threatening allergic reaction after a previous dose of any tetanus or diphtheria containing vaccine, OR has a severe allergy to any part of this vaccine, should not get Td vaccine. Tell the person giving the vaccine about any severe allergies.  Talk to your doctor if you:  had severe pain or swelling after any vaccine containing diphtheria or tetanus,  ever had a  condition called Guillain Barre Syndrome (GBS),  aren't feeling well on the day the shot is scheduled. 4. What are the risks from Td vaccine? With any medicine, including vaccines, there is a chance of side effects. These are usually mild and go away on their own. Serious reactions are also possible but are rare. Most people who get Td vaccine do not have any problems with it. Mild problems following Td vaccine:  (Did not interfere with activities)  Pain where the shot was given (about 8 people in 10)  Redness or swelling where the shot was given (about 1 person in 4)  Mild fever (rare)  Headache (about 1 person in 4)  Tiredness (about 1 person in 4) Moderate problems following Td vaccine:  (Interfered with activities, but did not require medical attention)  Fever over 102F (rare) Severe problems following Td vaccine:  (Unable to perform usual activities; required medical attention)  Swelling, severe pain, bleeding and/or redness in the arm where the shot was given (rare). Problems that could happen after any vaccine:   People sometimes faint after a medical procedure, including vaccination. Sitting or lying down for about 15 minutes can help prevent fainting, and injuries caused by a fall. Tell your doctor if you feel dizzy, or have vision changes or ringing in the ears.  Some people get severe pain in the shoulder and have difficulty moving the arm where a shot was given. This happens very rarely.  Any medication can cause a severe allergic reaction. Such reactions from a vaccine are very rare, estimated at fewer than 1 in a million doses, and would happen within a few minutes to a few hours after the vaccination. As with any medicine, there is a very remote chance of a vaccine causing a serious injury or death. The safety of vaccines is always being monitored. For more information, visit: http://www.aguilar.org/ 5. What if there is a serious reaction? What should I look  for?  Look for anything that concerns you, such as signs of a severe allergic reaction, very high fever, or unusual behavior. Signs of a severe allergic reaction can include hives, swelling of the face and throat, difficulty breathing, a fast heartbeat, dizziness, and weakness. These would usually start a few minutes to a few hours after the vaccination.  What should I do?   If you think it is a severe allergic reaction or other emergency that can't wait, call 9-1-1 or get the person to the nearest hospital. Otherwise, call your doctor.  Afterward, the reaction should be reported to the Vaccine Adverse Event Reporting System (VAERS). Your doctor might file this report, or you can do it yourself through the VAERS web site at www.vaers.SamedayNews.es, or by calling (910) 523-8312.  VAERS does not give medical advice. 6. The National Vaccine Injury Compensation Program The [employer] Vaccine Injury Compensation Program (VICP) is a federal program that was created to compensate people who may have been injured by certain vaccines. Persons who believe they may have been injured by a vaccine can learn about the program and about filing a claim by calling 859-196-5457 or visiting the [place] website at GoldCloset.com.ee. There is a time limit to file a claim for compensation. 7. How can I learn more?  Ask your doctor. He or she can give you the vaccine package insert or suggest other sources of information.  Call your local or state health department.  Contact the Centers for Disease Control and Prevention (CDC):  Call 670-089-3884 (1-800-CDC-INFO)  Visit CDC's website at http://hunter.com/ CDC Td Vaccine VIS (07/14/15) This information is not intended to replace advice given to you by your health care provider. Make sure you discuss any questions you have with your health care provider. Document Released: 01/16/2006 Document Revised: 12/10/2015 Document Reviewed: 12/10/2015 Elsevier  Interactive Patient Education  2017 [employer].

## 2016-06-07 NOTE — Progress Notes (Signed)
[name], is a 46 y.o. female  [id number]  [id number]  DOB - [birth year]  Chief Complaint  Patient presents with  . Hypertension  . Asthma        Subjective:   [name] is a 46 y.o. female here today for a follow up visit for htn and well controlled asthma. Does not smoke or drink etoh.  Uses her alb mdi perhaps 1-2 /wk, but no more than that. Taking bp meds as prescribed. No complaints today.   Patient has No headache, No chest pain, No abdominal pain - No Nausea, No new weakness tingling or numbness, No Cough - SOB.  bms normal.   Here w/ husband.    No problems updated.  ALLERGIES: No Known Allergies  PAST MEDICAL HISTORY: Past Medical History:  Diagnosis Date  . Hypertension     MEDICATIONS AT HOME: Prior to Admission medications   Medication Sig Start Date End Date Taking? Authorizing Provider  albuterol (PROVENTIL HFA;VENTOLIN HFA) 108 (90 Base) MCG/ACT inhaler Inhale 2 puffs into the lungs every 6 (six) hours as needed for wheezing or shortness of breath. 06/07/16   Maren Reamer, MD  ibuprofen (ADVIL,MOTRIN) 200 MG tablet Take 200-400 mg by mouth every 6 (six) hours as needed for mild pain or moderate pain.    Historical Provider, MD  lisinopril-hydrochlorothiazide (PRINZIDE,ZESTORETIC) 10-12.5 MG tablet Take 1 tablet by mouth daily. 06/07/16   Maren Reamer, MD     Objective:   Vitals:   06/07/16 0850  BP: 123/88  Pulse: 78  Resp: 16  Temp: 98.3 F (36.8 C)  TempSrc: Oral  SpO2: 100%  Weight: 152 lb (68.9 kg)    Exam General appearance : Awake, alert, not in any distress. Speech Clear. Not toxic looking, pleasant. HEENT: Atraumatic and Normocephalic, pupils equally reactive to light.  Mild ceruminosis bilat w/o impaction. Clear bilat tms. Neck: supple, no JVD. No goiter. Chest:Good air entry bilaterally, no added sounds. CVS: S1 S2 regular, no murmurs/gallups or rubs. Abdomen: Bowel sounds active, obese, Non  tender and not distended with no gaurding, rigidity or rebound. Extremities: B/L Lower Ext shows no edema, both legs are warm to touch Neurology: Awake alert, and oriented X 3, CN II-XII grossly intact, Non focal Skin:No Rash  Data Review Lab Results  Component Value Date   HGBA1C 5.3 06/07/2016    Depression screen [facility] 2/9 06/07/2016 11/23/2015 05/08/2014 04/30/2014  Decreased Interest 0 0 0 0  Down, Depressed, Hopeless 0 0 0 0  PHQ - 2 Score 0 0 0 0      Assessment & Plan   1. Essential hypertension [place] controlled, continue low salt diet, info provided. - lisinopril-hydrochlorothiazide (PRINZIDE,ZESTORETIC) 10-12.5 MG tablet; Take 1 tablet by mouth daily.  Dispense: 90 tablet; Refill: 3 - CBC with Differential - CMP and Liver - Lipid Panel  2. Diabetes mellitus screening - POCT A1C 5.3  3. History of asthma - well controlled. - albuterol (PROVENTIL HFA;VENTOLIN HFA) 108 (90 Base) MCG/ACT inhaler; Inhale 2 puffs into the lungs every 6 (six) hours as needed for wheezing or shortness of breath.  Dispense: 1 Inhaler; Refill: 11  4. Thyroid disorder screen - TSH  5. tdap today. Pt declined flu vac  6. Due for papsmear 05/2017.  Patient have been counseled extensively about nutrition and exercise  Return in about 6 months (around 12/08/2016), or if symptoms worsen or fail to improve.  The patient was given clear instructions to go to ER  or return to medical center if symptoms don't improve, worsen or new problems develop. The patient verbalized understanding. The patient was told to call to get lab results if they haven't heard anything in the next week.   This note has been created with Surveyor, quantity. Any transcriptional errors are unintentional.   Maren Reamer, MD, [place] [facility] [place], [place]   06/07/2016, 9:24 AM

## 2016-06-08 ENCOUNTER — Telehealth: Payer: Self-pay

## 2016-06-08 NOTE — Telephone Encounter (Signed)
Contacted pt to go over lab results pt didn't answer lvm asking pt to give me a call at her earliest convenience  

## 2016-08-18 ENCOUNTER — Encounter: Payer: Self-pay | Admitting: Internal Medicine

## 2016-08-19 ENCOUNTER — Encounter: Payer: Self-pay | Admitting: Internal Medicine

## 2016-08-22 ENCOUNTER — Encounter: Payer: Self-pay | Admitting: Internal Medicine

## 2017-05-31 ENCOUNTER — Encounter: Payer: Self-pay | Admitting: Nurse Practitioner

## 2017-05-31 ENCOUNTER — Ambulatory Visit: Payer: Managed Care, Other (non HMO) | Attending: Nurse Practitioner | Admitting: Nurse Practitioner

## 2017-05-31 VITALS — BP 122/87 | HR 92 | Temp 98.5°F | Ht 60.0 in | Wt 155.6 lb

## 2017-05-31 DIAGNOSIS — Z9049 Acquired absence of other specified parts of digestive tract: Secondary | ICD-10-CM | POA: Insufficient documentation

## 2017-05-31 DIAGNOSIS — I10 Essential (primary) hypertension: Secondary | ICD-10-CM

## 2017-05-31 DIAGNOSIS — Z79899 Other long term (current) drug therapy: Secondary | ICD-10-CM | POA: Diagnosis not present

## 2017-05-31 DIAGNOSIS — J4520 Mild intermittent asthma, uncomplicated: Secondary | ICD-10-CM

## 2017-05-31 DIAGNOSIS — Z833 Family history of diabetes mellitus: Secondary | ICD-10-CM | POA: Insufficient documentation

## 2017-05-31 DIAGNOSIS — Z9889 Other specified postprocedural states: Secondary | ICD-10-CM | POA: Diagnosis not present

## 2017-05-31 DIAGNOSIS — Z8249 Family history of ischemic heart disease and other diseases of the circulatory system: Secondary | ICD-10-CM | POA: Insufficient documentation

## 2017-05-31 MED ORDER — ALBUTEROL SULFATE HFA 108 (90 BASE) MCG/ACT IN AERS: 2.0000 | INHALATION_SPRAY | Freq: Four times a day (QID) | RESPIRATORY_TRACT | 11 refills | Status: DC | PRN

## 2017-05-31 MED ORDER — LISINOPRIL-HYDROCHLOROTHIAZIDE 10-12.5 MG PO TABS: 1.0000 | ORAL_TABLET | Freq: Every day | ORAL | 3 refills | Status: DC

## 2017-05-31 NOTE — Progress Notes (Signed)
Assessment & Plan:  [name] was seen today for establish care.  Diagnoses and all orders for this visit:  Essential hypertension -     lisinopril-hydrochlorothiazide (PRINZIDE,ZESTORETIC) 10-12.5 MG tablet; Take 1 tablet by mouth daily.  Continue all antihypertensives as prescribed.  Remember to bring in your blood pressure log with you for your follow up appointment.  DASH/Mediterranean Diets are healthier choices for HTN.    Mild intermittent asthma without complication -     albuterol (PROVENTIL HFA;VENTOLIN HFA) 108 (90 Base) MCG/ACT inhaler; Inhale 2 puffs into the lungs every 6 (six) hours as needed for wheezing or shortness of breath.   Patient has been counseled on age-appropriate routine health concerns for screening and prevention. These are reviewed and up-to-date. Referrals have been placed accordingly. Immunizations are up-to-date or declined.    Subjective:   Chief Complaint  Patient presents with  . Establish Care    Patient is here to establish care for hypertension and asthma.    HPI [name] 47 y.o. female presents to office today to establish care.   Asthma Chronic. Stable. Diagnosed as a child. Having to use inhaler no more than 1-2 times a week.   Essential Hypertension Chronic. Stable. Diagnosed 3 years ago. She does not check her blood pressure at home. Denies chest pain, shortness of breath, palpitations, lightheadedness, dizziness, headaches or BLE edema.  BP Readings from Last 3 Encounters:  05/31/17 122/87  06/07/16 123/88  11/23/15 121/85    Review of Systems  Constitutional: Negative for fever, malaise/fatigue and weight loss.       Hot flashes  HENT: Negative.  Negative for nosebleeds.   Eyes: Negative.  Negative for blurred vision, double vision and photophobia.  Respiratory: Negative.  Negative for cough and shortness of breath.   Cardiovascular: Negative.  Negative for chest pain, palpitations and leg swelling.    Gastrointestinal: Negative.  Negative for abdominal pain, constipation, diarrhea, heartburn, nausea and vomiting.  Musculoskeletal: Negative.  Negative for myalgias.  Neurological: Negative.  Negative for dizziness, focal weakness, seizures and headaches.  Endo/Heme/Allergies: Negative for environmental allergies.  Psychiatric/Behavioral: Negative.  Negative for suicidal ideas.    Past Medical History:  Diagnosis Date  . Asthma 1990  . Hypertension    2005    ye Past Surgical History:  Procedure Laterality Date  . CHOLECYSTECTOMY    . left eye surgery       Family History  Problem Relation Age of Onset  . Heart disease Father   . Hypertension Mother   . Diabetes Maternal Grandfather     Social History Reviewed with no changes to be made today.   Outpatient Medications Prior to Visit  Medication Sig Dispense Refill  . albuterol (PROVENTIL HFA;VENTOLIN HFA) 108 (90 Base) MCG/ACT inhaler Inhale 2 puffs into the lungs every 6 (six) hours as needed for wheezing or shortness of breath. 1 Inhaler 11  . lisinopril-hydrochlorothiazide (PRINZIDE,ZESTORETIC) 10-12.5 MG tablet Take 1 tablet by mouth daily. 90 tablet 3  . ibuprofen (ADVIL,MOTRIN) 200 MG tablet Take 200-400 mg by mouth every 6 (six) hours as needed for mild pain or moderate pain.     No facility-administered medications prior to visit.     No Known Allergies     Objective:    BP 122/87 (BP Location: Left Arm, Patient Position: Sitting, Cuff Size: Normal)   Pulse 92   Temp 98.5 F (36.9 C) (Oral)   Ht 5' (1.524 m)   Wt 155 lb 9.6  oz (70.6 kg)   LMP 05/15/2017   SpO2 93%   BMI 30.39 kg/m  Wt Readings from Last 3 Encounters:  05/31/17 155 lb 9.6 oz (70.6 kg)  06/07/16 152 lb (68.9 kg)  11/23/15 157 lb 6.4 oz (71.4 kg)    Physical Exam  Constitutional: She is oriented to person, place, and time. She appears well-developed and well-nourished. She is cooperative.  HENT:  Head: Normocephalic and atraumatic.   Eyes: EOM are normal.  Neck: Normal range of motion.  Cardiovascular: Normal rate, regular rhythm and normal heart sounds. Exam reveals no gallop and no friction rub.  No murmur heard. Pulmonary/Chest: Effort normal and breath sounds normal. No tachypnea. No respiratory distress. She has no decreased breath sounds. She has no wheezes. She has no rhonchi. She has no rales. She exhibits no tenderness.  Abdominal: Soft. Bowel sounds are normal.  Musculoskeletal: Normal range of motion.  Neurological: She is alert and oriented to person, place, and time. Coordination normal.  Skin: Skin is warm and dry.  Psychiatric: She has a normal mood and affect. Her behavior is normal. Judgment and thought content normal.  Nursing note and vitals reviewed.     Patient has been counseled extensively about nutrition and exercise as well as the importance of adherence with medications and regular follow-up. The patient was given clear instructions to go to ER or return to medical center if symptoms don't improve, worsen or new problems develop. The patient verbalized understanding.   Follow-up: Return for Fasting labs and Physical, PAP SMEAR.   [name], FNP-BC [facility] and [place], [place]   05/31/2017, 2:38 PM

## 2017-05-31 NOTE — Patient Instructions (Addendum)
At your next office appointment for your physical and PAP smear with labs. Please make sure you don't eat anything or drink anything except for your medication that morning. Perimenopause Perimenopause is the time when your body begins to move into the menopause (no menstrual period for 12 straight months). It is a natural process. Perimenopause can begin 2-8 years before the menopause and usually lasts for 1 year after the menopause. During this time, your ovaries may or may not produce an egg. The ovaries vary in their production of estrogen and progesterone hormones each month. This can cause irregular menstrual periods, difficulty getting pregnant, vaginal bleeding between periods, and uncomfortable symptoms. What are the causes?  Irregular production of the ovarian hormones, estrogen and progesterone, and not ovulating every month. Other causes include:  Tumor of the pituitary gland in the brain.  Medical disease that affects the ovaries.  Radiation treatment.  Chemotherapy.  Unknown causes.  Heavy smoking and excessive alcohol intake can bring on perimenopause sooner.  What are the signs or symptoms?  Hot flashes.  Night sweats.  Irregular menstrual periods.  Decreased sex drive.  Vaginal dryness.  Headaches.  Mood swings.  Depression.  Memory problems.  Irritability.  Tiredness.  Weight gain.  Trouble getting pregnant.  The beginning of losing bone cells (osteoporosis).  The beginning of hardening of the arteries (atherosclerosis). How is this diagnosed? Your health care provider will make a diagnosis by analyzing your age, menstrual history, and symptoms. He or she will do a physical exam and note any changes in your body, especially your female organs. Female hormone tests may or may not be helpful depending on the amount of female hormones you produce and when you produce them. However, other hormone tests may be helpful to rule out other problems. How is  this treated? In some cases, no treatment is needed. The decision on whether treatment is necessary during the perimenopause should be made by you and your health care provider based on how the symptoms are affecting you and your lifestyle. Various treatments are available, such as:  Treating individual symptoms with a specific medicine for that symptom.  Herbal medicines that can help specific symptoms.  Counseling.  Group therapy.  Follow these instructions at home:  Keep track of your menstrual periods (when they occur, how heavy they are, how long between periods, and how long they last) as well as your symptoms and when they started.  Only take over-the-counter or prescription medicines as directed by your health care provider.  Sleep and rest.  Exercise.  Eat a diet that contains calcium (good for your bones) and soy (acts like the estrogen hormone).  Do not smoke.  Avoid alcoholic beverages.  Take vitamin supplements as recommended by your health care provider. Taking vitamin E may help in certain cases.  Take calcium and vitamin D supplements to help prevent bone loss.  Group therapy is sometimes helpful.  Acupuncture may help in some cases. Contact a health care provider if:  You have questions about any symptoms you are having.  You need a referral to a specialist (gynecologist, psychiatrist, or psychologist). Get help right away if:  You have vaginal bleeding.  Your period lasts longer than 8 days.  Your periods are recurring sooner than 21 days.  You have bleeding after intercourse.  You have severe depression.  You have pain when you urinate.  You have severe headaches.  You have vision problems. This information is not intended to replace advice given  to you by your health care provider. Make sure you discuss any questions you have with your health care provider. Document Released: 04/28/2004 Document Revised: 08/27/2015 Document Reviewed:  10/18/2012 Elsevier Interactive Patient Education  2017 [employer].

## 2017-05-31 NOTE — Progress Notes (Signed)
Patient request a work note stating she was seen here today.

## 2017-06-01 ENCOUNTER — Ambulatory Visit: Payer: Managed Care, Other (non HMO) | Attending: Nurse Practitioner

## 2017-06-01 DIAGNOSIS — I10 Essential (primary) hypertension: Secondary | ICD-10-CM | POA: Diagnosis not present

## 2017-06-01 NOTE — Progress Notes (Signed)
Patient here for lab visit only 

## 2017-06-02 LAB — CMP14+EGFR
A/G RATIO: 1.5 (ref 1.2–2.2)
ALBUMIN: 4.4 g/dL (ref 3.5–5.5)
ALT: 12 IU/L (ref 0–32)
AST: 20 IU/L (ref 0–40)
Alkaline Phosphatase: 54 IU/L (ref 39–117)
BUN/Creatinine Ratio: 30 — ABNORMAL HIGH (ref 9–23)
BUN: 20 mg/dL (ref 6–24)
Bilirubin Total: 0.2 mg/dL (ref 0.0–1.2)
CALCIUM: 9.6 mg/dL (ref 8.7–10.2)
CO2: 21 mmol/L (ref 20–29)
CREATININE: 0.66 mg/dL (ref 0.57–1.00)
Chloride: 104 mmol/L (ref 96–106)
GFR calc Af Amer: 123 mL/min/{1.73_m2} (ref >59)
GFR, EST NON AFRICAN AMERICAN: 107 mL/min/{1.73_m2} (ref >59)
GLOBULIN, TOTAL: 2.9 g/dL (ref 1.5–4.5)
Glucose: 86 mg/dL (ref 65–99)
POTASSIUM: 4.4 mmol/L (ref 3.5–5.2)
SODIUM: 141 mmol/L (ref 134–144)
Total Protein: 7.3 g/dL (ref 6.0–8.5)

## 2017-06-02 LAB — CBC WITH DIFFERENTIAL/PLATELET
BASOS: 0 %
Basophils Absolute: 0 10*3/uL (ref 0.0–0.2)
EOS (ABSOLUTE): 0.3 10*3/uL (ref 0.0–0.4)
EOS: 4 %
HEMATOCRIT: 37.8 % (ref 34.0–46.6)
Hemoglobin: 12.7 g/dL (ref 11.1–15.9)
IMMATURE GRANULOCYTES: 0 %
Immature Grans (Abs): 0 10*3/uL (ref 0.0–0.1)
LYMPHS ABS: 2.7 10*3/uL (ref 0.7–3.1)
Lymphs: 33 %
MCH: 31.2 pg (ref 26.6–33.0)
MCHC: 33.6 g/dL (ref 31.5–35.7)
MCV: 93 fL (ref 79–97)
MONOS ABS: 0.6 10*3/uL (ref 0.1–0.9)
Monocytes: 8 %
Neutrophils Absolute: 4.4 10*3/uL (ref 1.4–7.0)
Neutrophils: 55 %
Platelets: 300 10*3/uL (ref 150–379)
RBC: 4.07 x10E6/uL (ref 3.77–5.28)
RDW: 15.4 % (ref 12.3–15.4)
WBC: 8.1 10*3/uL (ref 3.4–10.8)

## 2017-06-02 LAB — LIPID PANEL
CHOLESTEROL TOTAL: 170 mg/dL (ref 100–199)
Chol/HDL Ratio: 3 ratio (ref 0.0–4.4)
HDL: 56 mg/dL (ref >39)
LDL Calculated: 103 mg/dL — ABNORMAL HIGH (ref 0–99)
TRIGLYCERIDES: 57 mg/dL (ref 0–149)
VLDL Cholesterol Cal: 11 mg/dL (ref 5–40)

## 2017-06-05 ENCOUNTER — Other Ambulatory Visit: Payer: Self-pay | Admitting: Nurse Practitioner

## 2017-06-05 DIAGNOSIS — Z1231 Encounter for screening mammogram for malignant neoplasm of breast: Secondary | ICD-10-CM

## 2017-06-06 ENCOUNTER — Telehealth: Payer: Self-pay

## 2017-06-06 NOTE — Telephone Encounter (Signed)
-----   Message from [name], NP sent at 06/04/2017  8:09 PM EST ----- Labs are essentially normal. Make sure you are drinking at least 48 oz of water per day. Work on eating a low fat, heart healthy diet and participate in regular aerobic exercise program to control as well. Exercise at least 150 minutes per week.

## 2017-06-06 NOTE — Telephone Encounter (Signed)
CMA spoke to patient to inform on lab results and PCP advising.   Patient understood.

## 2017-06-21 ENCOUNTER — Other Ambulatory Visit (HOSPITAL_COMMUNITY)
Admission: RE | Admit: 2017-06-21 | Discharge: 2017-06-21 | Disposition: A | Discharge status: Home / Self Care | Payer: Managed Care, Other (non HMO) | Source: Ambulatory Visit | Attending: Nurse Practitioner | Admitting: Nurse Practitioner

## 2017-06-21 ENCOUNTER — Encounter: Payer: Self-pay | Admitting: Nurse Practitioner

## 2017-06-21 ENCOUNTER — Ambulatory Visit: Payer: Managed Care, Other (non HMO) | Attending: Nurse Practitioner | Admitting: Nurse Practitioner

## 2017-06-21 VITALS — BP 133/87 | HR 92 | Temp 98.7°F | Ht 60.0 in | Wt 156.8 lb

## 2017-06-21 DIAGNOSIS — Z01419 Encounter for gynecological examination (general) (routine) without abnormal findings: Secondary | ICD-10-CM | POA: Diagnosis present

## 2017-06-21 DIAGNOSIS — Z9049 Acquired absence of other specified parts of digestive tract: Secondary | ICD-10-CM | POA: Diagnosis not present

## 2017-06-21 DIAGNOSIS — Z79899 Other long term (current) drug therapy: Secondary | ICD-10-CM | POA: Diagnosis not present

## 2017-06-21 DIAGNOSIS — R8290 Unspecified abnormal findings in urine: Secondary | ICD-10-CM | POA: Insufficient documentation

## 2017-06-21 DIAGNOSIS — I10 Essential (primary) hypertension: Secondary | ICD-10-CM | POA: Diagnosis not present

## 2017-06-21 DIAGNOSIS — J45909 Unspecified asthma, uncomplicated: Secondary | ICD-10-CM | POA: Insufficient documentation

## 2017-06-21 DIAGNOSIS — M5382 Other specified dorsopathies, cervical region: Secondary | ICD-10-CM | POA: Diagnosis not present

## 2017-06-21 DIAGNOSIS — R221 Localized swelling, mass and lump, neck: Secondary | ICD-10-CM | POA: Diagnosis not present

## 2017-06-21 LAB — POCT URINALYSIS DIPSTICK
BILIRUBIN UA: NEGATIVE
GLUCOSE UA: NEGATIVE
Leukocytes, UA: NEGATIVE
Nitrite, UA: NEGATIVE
Protein, UA: NEGATIVE
RBC UA: NEGATIVE
Spec Grav, UA: 1.015 (ref 1.010–1.025)
Urobilinogen, UA: 0.2 E.U./dL
pH, UA: 6.5 (ref 5.0–8.0)

## 2017-06-21 NOTE — Progress Notes (Signed)
Assessment & Plan:  [name] was seen today for annual exam.  Diagnoses and all orders for this visit:  Well woman exam with routine gynecological exam -     Cytology - PAP -     Cervicovaginal ancillary only  Fullness of neck -     Thyroid Panel With TSH  Abnormal urine odor -     Urinalysis Dipstick   Patient has been counseled on age-appropriate routine health concerns for screening and prevention. These are reviewed and up-to-date. Referrals have been placed accordingly. Immunizations are up-to-date or declined.    Subjective:   Chief Complaint  Patient presents with  . Annual Exam    Patient is here for a physical and a pap.    HPI [name] 47 y.o. female presents to office today for annual physical and PAP. She has complaints of UTI symptoms with foul smelling urine. Denies dysuria, hematuria or flank pain.    Review of Systems  Constitutional: Negative.  Negative for chills, fever, malaise/fatigue and weight loss.  HENT: Negative.  Negative for congestion, hearing loss, sinus pain and sore throat.   Eyes: Negative.  Negative for blurred vision, double vision, photophobia and pain.  Respiratory: Negative.  Negative for cough, sputum production, shortness of breath and wheezing.   Cardiovascular: Negative.  Negative for chest pain and leg swelling.  Gastrointestinal: Negative.  Negative for abdominal pain, constipation, diarrhea, heartburn, nausea and vomiting.  Genitourinary: Negative for dysuria, frequency, hematuria and urgency.       Foul urine odor  Musculoskeletal: Negative.  Negative for joint pain and myalgias.  Skin: Negative.  Negative for rash.  Neurological: Negative.  Negative for dizziness, tremors, speech change, focal weakness, seizures and headaches.  Endo/Heme/Allergies: Negative.  Negative for environmental allergies.  Psychiatric/Behavioral: Negative.  Negative for depression and suicidal ideas. The patient is not nervous/anxious and  does not have insomnia.     Past Medical History:  Diagnosis Date  . Asthma 1990  . Hypertension    2005    Past Surgical History:  Procedure Laterality Date  . CHOLECYSTECTOMY    . left eye surgery       Family History  Problem Relation Age of Onset  . Heart disease Father   . Hypertension Mother   . Diabetes Maternal Grandfather     Social History Reviewed with no changes to be made today.   Outpatient Medications Prior to Visit  Medication Sig Dispense Refill  . albuterol (PROVENTIL HFA;VENTOLIN HFA) 108 (90 Base) MCG/ACT inhaler Inhale 2 puffs into the lungs every 6 (six) hours as needed for wheezing or shortness of breath. 1 Inhaler 11  . ibuprofen (ADVIL,MOTRIN) 200 MG tablet Take 200-400 mg by mouth every 6 (six) hours as needed for mild pain or moderate pain.    Marland Kitchen lisinopril-hydrochlorothiazide (PRINZIDE,ZESTORETIC) 10-12.5 MG tablet Take 1 tablet by mouth daily. 90 tablet 3   No facility-administered medications prior to visit.     No Known Allergies     Objective:    BP 133/87 (BP Location: Right Arm, Patient Position: Sitting, Cuff Size: Normal)   Pulse 92   Temp 98.7 F (37.1 C) (Oral)   Ht 5' (1.524 m)   Wt 156 lb 12.8 oz (71.1 kg)   LMP 06/12/2017   SpO2 99%   BMI 30.62 kg/m  Wt Readings from Last 3 Encounters:  06/21/17 156 lb 12.8 oz (71.1 kg)  05/31/17 155 lb 9.6 oz (70.6 kg)  06/07/16 152 lb (68.9  kg)    Physical Exam  Constitutional: She is oriented to person, place, and time. She appears well-developed and well-nourished. No distress.  HENT:  Head: Normocephalic and atraumatic.  Right Ear: External ear normal.  Left Ear: External ear normal.  Nose: Nose normal.  Mouth/Throat: Oropharynx is clear and moist. Abnormal dentition. Dental caries present. No oropharyngeal exudate.  Poor dentition  Eyes: Conjunctivae and EOM are normal. Pupils are equal, round, and reactive to light. Right eye exhibits no discharge. Left eye exhibits no  discharge. No scleral icterus.  Neck: Normal range of motion. Neck supple. No tracheal deviation present. Thyromegaly present.  Cardiovascular: Normal rate, regular rhythm, normal heart sounds and intact distal pulses. Exam reveals no friction rub.  No murmur heard. Pulmonary/Chest: Effort normal and breath sounds normal. No respiratory distress. She has no decreased breath sounds. She has no wheezes. She has no rhonchi. She has no rales. She exhibits no tenderness. Right breast exhibits no inverted nipple, no mass, no nipple discharge, no skin change and no tenderness. Left breast exhibits no inverted nipple, no mass, no nipple discharge, no skin change and no tenderness.  Abdominal: Soft. Bowel sounds are normal. She exhibits no distension and no mass. There is no tenderness. There is no rebound, no guarding and no CVA tenderness. Hernia confirmed negative in the right inguinal area and confirmed negative in the left inguinal area.  Genitourinary: Rectal exam shows no external hemorrhoid, no fissure, no mass and anal tone normal. No breast swelling, tenderness, discharge or bleeding. No labial fusion. There is no rash, tenderness, lesion or injury on the right labia. There is no rash, tenderness, lesion or injury on the left labia. Uterus is not tender. Cervix exhibits no motion tenderness, no discharge and no friability. Right adnexum displays no mass, no tenderness and no fullness. Left adnexum displays no mass, no tenderness and no fullness. No erythema, tenderness or bleeding in the vagina. No foreign body in the vagina. No signs of injury around the vagina. Vaginal discharge found.  Musculoskeletal: Normal range of motion. She exhibits no edema, tenderness or deformity.  Lymphadenopathy:    She has no cervical adenopathy.  Neurological: She is alert and oriented to person, place, and time. No cranial nerve deficit. Coordination normal.  Skin: Skin is warm and dry. No rash noted. She is not  diaphoretic. No erythema. No pallor.  Psychiatric: She has a normal mood and affect. Her behavior is normal. Judgment and thought content normal.      Patient has been counseled extensively about nutrition and exercise as well as the importance of adherence with medications and regular follow-up. The patient was given clear instructions to go to ER or return to medical center if symptoms don't improve, worsen or new problems develop. The patient verbalized understanding.   Follow-up: Return in about 3 months (around 09/21/2017) for HTN .   [name], FNP-BC [facility] and [facility], [place]   06/22/2017, 12:51 AM

## 2017-06-21 NOTE — Patient Instructions (Addendum)
Pap Test Why am I having this test? A pap test is sometimes called a pap smear. It is a screening test that is used to check for signs of cancer of the vagina, cervix, and uterus. The test can also identify the presence of infection or precancerous changes. Your health care provider will likely recommend you have this test done on a regular basis. This test may be done:  Every 3 years, starting at age 45.  Every 5 years, in combination with testing for the presence of human papillomavirus (HPV).  More or less often depending on other medical conditions.  What kind of sample is taken? Using a small cotton swab, plastic spatula, or brush, your health care provider will collect a sample of cells from the surface of your cervix. Your cervix is the opening to your uterus, also called a womb. Secretions from the cervix and vagina may also be collected. How do I prepare for this test?  Be aware of where you are in your menstrual cycle. You may be asked to reschedule the test if you are menstruating on the day of the test.  You may need to reschedule if you have a known vaginal infection on the day of the test.  You may be asked to avoid douching or taking a bath the day before or the day of the test.  Some medicines can cause abnormal test results, such as digitalis and tetracycline. Talk with your health care provider before your test if you take one of these medicines. What do the results mean? Abnormal test results may indicate a number of health conditions. These may include:  Cancer. Although pap test results cannot be used to diagnose cancer of the cervix, vagina, or uterus, they may suggest the possibility of cancer. Further tests would be required to determine if cancer is present.  Sexually transmitted disease.  Fungal infection.  Parasite infection.  Herpes infection.  A condition causing or contributing to infertility.  It is your responsibility to obtain your test results.  Ask the lab or department performing the test when and how you will get your results. Contact your health care provider to discuss any questions you have about your results. Talk with your health care provider to discuss your results, treatment options, and if necessary, the need for more tests. Talk with your health care provider if you have any questions about your results. This information is not intended to replace advice given to you by your health care provider. Make sure you discuss any questions you have with your health care provider. Document Released: 06/11/2002 Document Revised: 11/25/2015 Document Reviewed: 08/12/2013 Elsevier Interactive Patient Education  2018 Elsevier Inc.  Preventing Cervical Cancer Cervical cancer is cancer that grows on the cervix. The cervix is at the bottom of the uterus. It connects the uterus to the vagina. The uterus is where a baby develops during pregnancy. Cancer occurs when cells become abnormal and start to grow out of control. Cervical cancer grows slowly and may not cause any symptoms at first. Over time, the cancer can grow deep into the cervix tissue and spread to other areas. If it is found early, cervical cancer can be treated effectively. You can also take steps to prevent this type of cancer. Most cases of cervical cancer are caused by an STI (sexually transmitted infection) called human papillomavirus (HPV). One way to reduce your risk of cervical cancer is to avoid infection with the HPV virus. You can do this by practicing safe   sex and by getting the HPV vaccine. Getting regular Pap tests is also important because this can help identify changes in cells that could lead to cancer. Your chances of getting this disease can also be reduced by making certain lifestyle changes. How can I protect myself from cervical cancer? Preventing HPV infection  Ask your health care provider about getting the HPV vaccine. If you are 26 years old or younger, you may  need to get this vaccine, which is given in three doses over 6 months. This vaccine protects against the types of HPV that could cause cancer.  Limit the number of people you have sex with. Also avoid having sex with people who have had many sex partners.  Use a latex condom during sex. Getting Pap tests  Get Pap tests regularly, starting at age 45. Talk with your health care provider about how often you need these tests. ? Most women who are 21?45 years of age should have a Pap test every 3 years. ? Most women who are 30?45 years of age should have a Pap test in combination with an HPV test every 5 years. ? Women with a higher risk of cervical cancer, such as those with a weakened immune system or those who have been exposed to the drug diethylstilbestrol (DES), may need more frequent testing. Making other lifestyle changes  Do not use any products that contain nicotine or tobacco, such as cigarettes and e-cigarettes. If you need help quitting, ask your health care provider.  Eat at least 5 servings of fruits and vegetables every day.  Lose weight if you are overweight. Why are these changes important?  These changes and screening tests are designed to address the factors that are known to increase the risk of cervical cancer. Taking these steps is the best way to reduce your risk.  Having regular Pap tests will help identify changes in cells that could lead to cancer. Steps can then be taken to prevent cancer from developing.  These changes will also help find cervical cancer early. This type of cancer can be treated effectively if it is found early. It can be more dangerous and difficult to treat if cancer has grown deep into your cervix or has spread.  In addition to making you less likely to get cervical cancer, these changes will also provide other health benefits, such as the following: ? Practicing safe sex is important for preventing STIs and unplanned pregnancies. ? Avoiding  tobacco can reduce your risk for other cancers and health issues. ? Eating a healthy diet and maintaining a healthy weight are good for your overall health. What can happen if changes are not made? In the early stages, cervical cancer might not have any symptoms. It can take many years for the cancer to grow and get deep into the cervix tissue. This may be happening without you knowing about it. If you develop any symptoms, such as pelvic pain or unusual discharge or bleeding from your vagina, you should see your health care provider right away. If cervical cancer is not found early, you might need treatments such as radiation, chemotherapy, or surgery. In some cases, surgery may mean that you will not be able to get pregnant or carry a pregnancy to term. Where to find support: Talk with your health care provider, school nurse, or local health department for guidance about screening and vaccination. Some children and teens may be able to get the HPV vaccine free of charge through the U.[place]. government's   Vaccines for Children [facility]) program. Other places that provide vaccinations include:  Public health clinics. Check with your local health department.  [place], where you would pay only what you can afford. To find one near you, check this website: http://lyons.com/  [place]. These are part of a program for Medicare and Medicaid patients who live in rural areas.  The National Breast and Cervical Cancer Early Detection Program also provides breast and cervical cancer screenings and diagnostic services to low-income, uninsured, and underinsured women. Cervical cancer can be passed down through families. Talk with your health care provider or genetic counselor to learn more about genetic testing for cancer. Where to find more information: Learn more about cervical cancer from:  [employer]ne[employer]:  WirelessShades.ch  American Cancer Society: www.cancer.org/cancer/cervicalcancer/  U.S. Centers for Disease Control and Prevention: ParisianParasols.gl  Summary  Talk with your health care provider about getting the HPV vaccine.  Be sure to get regular Pap tests as recommended by your health care provider.  See your health care provider right away if you have any pelvic pain or unusual discharge or bleeding from your vagina. This information is not intended to replace advice given to you by your health care provider. Make sure you discuss any questions you have with your health care provider. Document Released: 04/05/2015 Document Revised: 11/17/2015 Document Reviewed: 11/17/2015 Elsevier Interactive Patient Education  2018 [place] Eating Plan DASH stands for "Dietary Approaches to Stop Hypertension." The DASH eating plan is a healthy eating plan that has been shown to reduce high blood pressure (hypertension). It may also reduce your risk for type 2 diabetes, heart disease, and stroke. The DASH eating plan may also help with weight loss. What are tips for following this plan? General guidelines  Avoid eating more than 2,300 mg (milligrams) of salt (sodium) a day. If you have hypertension, you may need to reduce your sodium intake to 1,500 mg a day.  Limit alcohol intake to no more than 1 drink a day for nonpregnant women and 2 drinks a day for men. One drink equals 12 oz of beer, 5 oz of wine, or 1 oz of hard liquor.  Work with your health care provider to maintain a healthy body weight or to lose weight. Ask what an ideal weight is for you.  Get at least 30 minutes of exercise that causes your heart to beat faster (aerobic exercise) most days of the week. Activities may include walking, swimming, or biking.  Work with your health care provider or diet and nutrition specialist (dietitian) to adjust your eating plan to your individual  calorie needs. Reading food labels  Check food labels for the amount of sodium per serving. Choose foods with less than 5 percent of the Daily Value of sodium. Generally, foods with less than 300 mg of sodium per serving fit into this eating plan.  To find whole grains, look for the word "whole" as the first word in the ingredient list. Shopping  Buy products labeled as "low-sodium" or "no salt added."  Buy fresh foods. Avoid canned foods and premade or frozen meals. Cooking  Avoid adding salt when cooking. Use salt-free seasonings or herbs instead of table salt or sea salt. Check with your health care provider or pharmacist before using salt substitutes.  Do not fry foods. Cook foods using healthy methods such as baking, boiling, grilling, and broiling instead.  Cook with heart-healthy oils, such as olive, canola, soybean, or sunflower  oil. Meal planning   Eat a balanced diet that includes: ? 5 or more servings of fruits and vegetables each day. At each meal, try to fill half of your plate with fruits and vegetables. ? Up to 6-8 servings of whole grains each day. ? Less than 6 oz of lean meat, poultry, or fish each day. A 3-oz serving of meat is about the same size as a deck of cards. One egg equals 1 oz. ? 2 servings of low-fat dairy each day. ? A serving of nuts, seeds, or beans 5 times each week. ? Heart-healthy fats. Healthy fats called Omega-3 fatty acids are found in foods such as flaxseeds and coldwater fish, like sardines, salmon, and mackerel.  Limit how much you eat of the following: ? Canned or prepackaged foods. ? Food that is high in trans fat, such as fried foods. ? Food that is high in saturated fat, such as fatty meat. ? Sweets, desserts, sugary drinks, and other foods with added sugar. ? Full-fat dairy products.  Do not salt foods before eating.  Try to eat at least 2 vegetarian meals each week.  Eat more home-cooked food and less restaurant, buffet, and fast  food.  When eating at a restaurant, ask that your food be prepared with less salt or no salt, if possible. What foods are recommended? The items listed may not be a complete list. Talk with your dietitian about what dietary choices are best for you. Grains Whole-grain or whole-wheat bread. Whole-grain or whole-wheat pasta. Brown rice. [place]. Bulgur. Whole-grain and low-sodium cereals. Pita bread. Low-fat, low-sodium crackers. Whole-wheat flour tortillas. Vegetables Fresh or frozen vegetables (raw, steamed, roasted, or grilled). Low-sodium or reduced-sodium tomato and vegetable juice. Low-sodium or reduced-sodium tomato sauce and tomato paste. Low-sodium or reduced-sodium canned vegetables. Fruits All fresh, dried, or frozen fruit. Canned fruit in natural juice (without added sugar). Meat and other protein foods Skinless chicken or [place]. Ground chicken or [place]. Pork with fat trimmed off. Fish and seafood. Egg whites. Dried beans, peas, or lentils. Unsalted nuts, nut butters, and seeds. Unsalted canned beans. Lean cuts of beef with fat trimmed off. Low-sodium, lean deli meat. Dairy Low-fat (1%) or fat-free (skim) milk. Fat-free, low-fat, or reduced-fat cheeses. Nonfat, low-sodium ricotta or cottage cheese. Low-fat or nonfat yogurt. Low-fat, low-sodium cheese. Fats and oils Soft margarine without trans fats. Vegetable oil. Low-fat, reduced-fat, or light mayonnaise and salad dressings (reduced-sodium). Canola, safflower, olive, soybean, and sunflower oils. Avocado. Seasoning and other foods Herbs. Spices. Seasoning mixes without salt. Unsalted popcorn and pretzels. Fat-free sweets. What foods are not recommended? The items listed may not be a complete list. Talk with your dietitian about what dietary choices are best for you. Grains Baked goods made with fat, such as croissants, muffins, or some breads. Dry pasta or rice meal packs. Vegetables Creamed or fried vegetables. Vegetables  in a cheese sauce. Regular canned vegetables (not low-sodium or reduced-sodium). Regular canned tomato sauce and paste (not low-sodium or reduced-sodium). Regular tomato and vegetable juice (not low-sodium or reduced-sodium). Angie Fava. Olives. Fruits Canned fruit in a light or heavy syrup. Fried fruit. Fruit in cream or butter sauce. Meat and other protein foods Fatty cuts of meat. Ribs. Fried meat. Berniece Salines. Sausage. Bologna and other processed lunch meats. Salami. Fatback. Hotdogs. Bratwurst. Salted nuts and seeds. Canned beans with added salt. Canned or smoked fish. Whole eggs or egg yolks. Chicken or [place] with skin. Dairy Whole or 2% milk, cream, and half-and-half. Whole or full-fat cream  cheese. Whole-fat or sweetened yogurt. Full-fat cheese. Nondairy creamers. Whipped toppings. Processed cheese and cheese spreads. Fats and oils Butter. Stick margarine. Lard. Shortening. Ghee. Bacon fat. Tropical oils, such as coconut, palm kernel, or palm oil. Seasoning and other foods Salted popcorn and pretzels. Onion salt, garlic salt, seasoned salt, table salt, and sea salt. Worcestershire sauce. Tartar sauce. Barbecue sauce. Teriyaki sauce. Soy sauce, including reduced-sodium. Steak sauce. Canned and packaged gravies. Fish sauce. Oyster sauce. Cocktail sauce. Horseradish that you find on the shelf. Ketchup. Mustard. Meat flavorings and tenderizers. Bouillon cubes. Hot sauce and Tabasco sauce. Premade or packaged marinades. Premade or packaged taco seasonings. Relishes. Regular salad dressings. Where to find more information:  National Heart, Lung, and [place]: https://wilson-eaton.com/  American Heart Association: www.heart.org Summary  The DASH eating plan is a healthy eating plan that has been shown to reduce high blood pressure (hypertension). It may also reduce your risk for type 2 diabetes, heart disease, and stroke.  With the DASH eating plan, you should limit salt (sodium) intake to 2,300 mg a  day. If you have hypertension, you may need to reduce your sodium intake to 1,500 mg a day.  When on the DASH eating plan, aim to eat more fresh fruits and vegetables, whole grains, lean proteins, low-fat dairy, and heart-healthy fats.  Work with your health care provider or diet and nutrition specialist (dietitian) to adjust your eating plan to your individual calorie needs. This information is not intended to replace advice given to you by your health care provider. Make sure you discuss any questions you have with your health care provider. Document Released: 03/10/2011 Document Revised: 03/14/2016 Document Reviewed: 03/14/2016 Elsevier Interactive Patient Education  [employer].

## 2017-06-22 ENCOUNTER — Ambulatory Visit
Admission: RE | Admit: 2017-06-22 | Discharge: 2017-06-22 | Disposition: A | Discharge status: Home / Self Care | Payer: Managed Care, Other (non HMO) | Source: Ambulatory Visit | Attending: Nurse Practitioner | Admitting: Nurse Practitioner

## 2017-06-22 ENCOUNTER — Telehealth: Payer: Self-pay

## 2017-06-22 DIAGNOSIS — Z1231 Encounter for screening mammogram for malignant neoplasm of breast: Secondary | ICD-10-CM

## 2017-06-22 LAB — THYROID PANEL WITH TSH
FREE THYROXINE INDEX: 1.7 (ref 1.2–4.9)
T3 UPTAKE RATIO: 24 % (ref 24–39)
T4 TOTAL: 7.1 ug/dL (ref 4.5–12.0)
TSH: 1.21 u[IU]/mL (ref 0.450–4.500)

## 2017-06-22 LAB — CERVICOVAGINAL ANCILLARY ONLY
Bacterial vaginitis: NEGATIVE
Candida vaginitis: NEGATIVE
Chlamydia: NEGATIVE
Neisseria Gonorrhea: NEGATIVE
TRICH (WINDOWPATH): NEGATIVE

## 2017-06-22 NOTE — Telephone Encounter (Signed)
-----   Message from [name], NP sent at 06/22/2017 12:51 AM EDT ----- UA is negative for UTI

## 2017-06-22 NOTE — Telephone Encounter (Signed)
CMA attempt to call patient to inform on lab results.  No answer and left a VM for patient to call back.  If patient call back, please inform: UA is negative for UTI

## 2017-06-23 ENCOUNTER — Telehealth: Payer: Self-pay

## 2017-06-23 LAB — CYTOLOGY - PAP
DIAGNOSIS: NEGATIVE
HPV (WINDOPATH): NOT DETECTED

## 2017-06-23 NOTE — Telephone Encounter (Signed)
-----   Message from [name], NP sent at 06/22/2017 11:25 PM EDT ----- Awaiting PAP smear results. Wet prep was negative for any vaginal bacteria.

## 2017-06-23 NOTE — Telephone Encounter (Signed)
CMA spoke to patient to inform on lab results.  Patient understood.  

## 2017-07-03 ENCOUNTER — Telehealth: Payer: Self-pay

## 2017-07-03 NOTE — Telephone Encounter (Signed)
-----   Message from [name], NP sent at 07/02/2017  7:25 PM EDT ----- PAP SMEAR IS NORMAL and additional testing did not show any bacteria, gonorrhea, chlamydia, yeast or trichomonas . Next pap due 2022

## 2017-07-03 NOTE — Telephone Encounter (Signed)
CMA attempt to call patient to inform on lab results.  No answer and left a VM for patient to call back for results.   If patient call back, please inform: PAP SMEAR IS NORMAL and additional testing did not show any bacteria, gonorrhea, chlamydia, yeast or trichomonas . Next pap due 2022

## 2017-09-25 ENCOUNTER — Encounter: Payer: Self-pay | Admitting: Nurse Practitioner

## 2017-09-25 ENCOUNTER — Ambulatory Visit: Payer: Managed Care, Other (non HMO) | Attending: Nurse Practitioner | Admitting: Nurse Practitioner

## 2017-09-25 VITALS — BP 117/82 | HR 78 | Temp 98.4°F | Ht 60.0 in | Wt 159.0 lb

## 2017-09-25 DIAGNOSIS — Z79899 Other long term (current) drug therapy: Secondary | ICD-10-CM | POA: Diagnosis not present

## 2017-09-25 DIAGNOSIS — Z8249 Family history of ischemic heart disease and other diseases of the circulatory system: Secondary | ICD-10-CM | POA: Diagnosis not present

## 2017-09-25 DIAGNOSIS — I10 Essential (primary) hypertension: Secondary | ICD-10-CM | POA: Insufficient documentation

## 2017-09-25 DIAGNOSIS — Z9049 Acquired absence of other specified parts of digestive tract: Secondary | ICD-10-CM | POA: Insufficient documentation

## 2017-09-25 DIAGNOSIS — J45909 Unspecified asthma, uncomplicated: Secondary | ICD-10-CM | POA: Insufficient documentation

## 2017-09-25 MED ORDER — LISINOPRIL-HYDROCHLOROTHIAZIDE 10-12.5 MG PO TABS: 1.0000 | ORAL_TABLET | Freq: Every day | ORAL | 1 refills | Status: DC

## 2017-09-25 NOTE — Progress Notes (Signed)
Assessment & Plan:  [name] was seen today for follow-up.  Diagnoses and all orders for this visit:  Essential hypertension -     lisinopril-hydrochlorothiazide (PRINZIDE,ZESTORETIC) 10-12.5 MG tablet; Take 1 tablet by mouth daily.    Patient has been counseled on age-appropriate routine health concerns for screening and prevention. These are reviewed and up-to-date. Referrals have been placed accordingly. Immunizations are up-to-date or declined.    Subjective:   Chief Complaint  Patient presents with  . Follow-up    Pt. is here for follow-up on hypertension. Pt. woud like to talk to PCP regarding thyroid resutls.    HPI [name] 47 y.o. female presents to office today for follow up HTN. She is also requesting the results of her recent thyroid blood work which was normal.  CHRONIC HYPERTENSION Disease Monitoring  Blood pressure range: She does not check her blood pressure at home.  Chest pain: no   Dyspnea: no   Claudication: no  Medication compliance: yes, taking prinzide 10-12.5mg  daily Medication Side Effects  Lightheadedness: no   Urinary frequency: no   Edema: no   Impotence: no  Preventitive Healthcare:  Exercise: no   Diet Pattern: salt not added to cooking  Salt Restriction:  yes  BP Readings from Last 3 Encounters:  09/25/17 117/82  06/21/17 133/87  05/31/17 122/87    Review of Systems  Constitutional: Negative for fever, malaise/fatigue and weight loss.  HENT: Negative.  Negative for nosebleeds.   Eyes: Negative.  Negative for blurred vision, double vision and photophobia.  Respiratory: Negative.  Negative for cough and shortness of breath.   Cardiovascular: Negative.  Negative for chest pain, palpitations and leg swelling.  Gastrointestinal: Negative.  Negative for heartburn, nausea and vomiting.  Musculoskeletal: Negative.  Negative for myalgias.  Neurological: Negative.  Negative for dizziness, focal weakness, seizures and headaches.    Psychiatric/Behavioral: Negative.  Negative for suicidal ideas.    Past Medical History:  Diagnosis Date  . Asthma 1990  . Hypertension    2005    Past Surgical History:  Procedure Laterality Date  . CHOLECYSTECTOMY    . left eye surgery       Family History  Problem Relation Age of Onset  . Heart disease Father   . Hypertension Mother   . Diabetes Maternal Grandfather     Social History Reviewed with no changes to be made today.   Outpatient Medications Prior to Visit  Medication Sig Dispense Refill  . albuterol (PROVENTIL HFA;VENTOLIN HFA) 108 (90 Base) MCG/ACT inhaler Inhale 2 puffs into the lungs every 6 (six) hours as needed for wheezing or shortness of breath. 1 Inhaler 11  . ibuprofen (ADVIL,MOTRIN) 200 MG tablet Take 200-400 mg by mouth every 6 (six) hours as needed for mild pain or moderate pain.    Marland Kitchen lisinopril-hydrochlorothiazide (PRINZIDE,ZESTORETIC) 10-12.5 MG tablet Take 1 tablet by mouth daily. 90 tablet 3   No facility-administered medications prior to visit.     No Known Allergies     Objective:    BP 117/82 (BP Location: Right Arm, Patient Position: Sitting, Cuff Size: Normal)   Pulse 78   Temp 98.4 F (36.9 C) (Oral)   Ht 5' (1.524 m)   Wt 159 lb (72.1 kg)   SpO2 97%   BMI 31.05 kg/m  Wt Readings from Last 3 Encounters:  09/25/17 159 lb (72.1 kg)  06/21/17 156 lb 12.8 oz (71.1 kg)  05/31/17 155 lb 9.6 oz (70.6 kg)    Physical  Exam  Constitutional: She is oriented to person, place, and time. She appears well-developed and well-nourished. She is cooperative.  HENT:  Head: Normocephalic and atraumatic.  Eyes: EOM are normal.  Neck: Normal range of motion.  Cardiovascular: Normal rate, regular rhythm and normal heart sounds. Exam reveals no gallop and no friction rub.  No murmur heard. Pulmonary/Chest: Effort normal and breath sounds normal. No tachypnea. No respiratory distress. She has no decreased breath sounds. She has no wheezes. She  has no rhonchi. She has no rales. She exhibits no tenderness.  Abdominal: Soft. Bowel sounds are normal.  Musculoskeletal: Normal range of motion. She exhibits no edema.  Neurological: She is alert and oriented to person, place, and time. Coordination normal.  Skin: Skin is warm and dry.  Psychiatric: She has a normal mood and affect. Her behavior is normal. Judgment and thought content normal.  Nursing note and vitals reviewed.      Patient has been counseled extensively about nutrition and exercise as well as the importance of adherence with medications and regular follow-up. The patient was given clear instructions to go to ER or return to medical center if symptoms don't improve, worsen or new problems develop. The patient verbalized understanding.   Follow-up: Return in about 6 months (around 03/27/2018) for HTN.   [name], FNP-BC [facility] [place], [place]   09/25/2017, 10:11 AM

## 2017-09-25 NOTE — Patient Instructions (Signed)
DASH Eating Plan DASH stands for "Dietary Approaches to Stop Hypertension." The DASH eating plan is a healthy eating plan that has been shown to reduce high blood pressure (hypertension). It may also reduce your risk for type 2 diabetes, heart disease, and stroke. The DASH eating plan may also help with weight loss. What are tips for following this plan? General guidelines  Avoid eating more than 2,300 mg (milligrams) of salt (sodium) a day. If you have hypertension, you may need to reduce your sodium intake to 1,500 mg a day.  Limit alcohol intake to no more than 1 drink a day for nonpregnant women and 2 drinks a day for men. One drink equals 12 oz of beer, 5 oz of wine, or 1 oz of hard liquor.  Work with your health care provider to maintain a healthy body weight or to lose weight. Ask what an ideal weight is for you.  Get at least 30 minutes of exercise that causes your heart to beat faster (aerobic exercise) most days of the week. Activities may include walking, swimming, or biking.  Work with your health care provider or diet and nutrition specialist (dietitian) to adjust your eating plan to your individual calorie needs. Reading food labels  Check food labels for the amount of sodium per serving. Choose foods with less than 5 percent of the Daily Value of sodium. Generally, foods with less than 300 mg of sodium per serving fit into this eating plan.  To find whole grains, look for the word "whole" as the first word in the ingredient list. Shopping  Buy products labeled as "low-sodium" or "no salt added."  Buy fresh foods. Avoid canned foods and premade or frozen meals. Cooking  Avoid adding salt when cooking. Use salt-free seasonings or herbs instead of table salt or sea salt. Check with your health care provider or pharmacist before using salt substitutes.  Do not fry foods. Cook foods using healthy methods such as baking, boiling, grilling, and broiling instead.  Cook with  heart-healthy oils, such as olive, canola, soybean, or sunflower oil. Meal planning   Eat a balanced diet that includes: ? 5 or more servings of fruits and vegetables each day. At each meal, try to fill half of your plate with fruits and vegetables. ? Up to 6-8 servings of whole grains each day. ? Less than 6 oz of lean meat, poultry, or fish each day. A 3-oz serving of meat is about the same size as a deck of cards. One egg equals 1 oz. ? 2 servings of low-fat dairy each day. ? A serving of nuts, seeds, or beans 5 times each week. ? Heart-healthy fats. Healthy fats called Omega-3 fatty acids are found in foods such as flaxseeds and coldwater fish, like sardines, salmon, and mackerel.  Limit how much you eat of the following: ? Canned or prepackaged foods. ? Food that is high in trans fat, such as fried foods. ? Food that is high in saturated fat, such as fatty meat. ? Sweets, desserts, sugary drinks, and other foods with added sugar. ? Full-fat dairy products.  Do not salt foods before eating.  Try to eat at least 2 vegetarian meals each week.  Eat more home-cooked food and less restaurant, buffet, and fast food.  When eating at a restaurant, ask that your food be prepared with less salt or no salt, if possible. What foods are recommended? The items listed may not be a complete list. Talk with your dietitian about what   dietary choices are best for you. Grains Whole-grain or whole-wheat bread. Whole-grain or whole-wheat pasta. Brown rice. Oatmeal. Quinoa. Bulgur. Whole-grain and low-sodium cereals. Pita bread. Low-fat, low-sodium crackers. Whole-wheat flour tortillas. Vegetables Fresh or frozen vegetables (raw, steamed, roasted, or grilled). Low-sodium or reduced-sodium tomato and vegetable juice. Low-sodium or reduced-sodium tomato sauce and tomato paste. Low-sodium or reduced-sodium canned vegetables. Fruits All fresh, dried, or frozen fruit. Canned fruit in natural juice (without  added sugar). Meat and other protein foods Skinless chicken or turkey. Ground chicken or turkey. Pork with fat trimmed off. Fish and seafood. Egg whites. Dried beans, peas, or lentils. Unsalted nuts, nut butters, and seeds. Unsalted canned beans. Lean cuts of beef with fat trimmed off. Low-sodium, lean deli meat. Dairy Low-fat (1%) or fat-free (skim) milk. Fat-free, low-fat, or reduced-fat cheeses. Nonfat, low-sodium ricotta or cottage cheese. Low-fat or nonfat yogurt. Low-fat, low-sodium cheese. Fats and oils Soft margarine without trans fats. Vegetable oil. Low-fat, reduced-fat, or light mayonnaise and salad dressings (reduced-sodium). Canola, safflower, olive, soybean, and sunflower oils. Avocado. Seasoning and other foods Herbs. Spices. Seasoning mixes without salt. Unsalted popcorn and pretzels. Fat-free sweets. What foods are not recommended? The items listed may not be a complete list. Talk with your dietitian about what dietary choices are best for you. Grains Baked goods made with fat, such as croissants, muffins, or some breads. Dry pasta or rice meal packs. Vegetables Creamed or fried vegetables. Vegetables in a cheese sauce. Regular canned vegetables (not low-sodium or reduced-sodium). Regular canned tomato sauce and paste (not low-sodium or reduced-sodium). Regular tomato and vegetable juice (not low-sodium or reduced-sodium). Pickles. Olives. Fruits Canned fruit in a light or heavy syrup. Fried fruit. Fruit in cream or butter sauce. Meat and other protein foods Fatty cuts of meat. Ribs. Fried meat. Bacon. Sausage. Bologna and other processed lunch meats. Salami. Fatback. Hotdogs. Bratwurst. Salted nuts and seeds. Canned beans with added salt. Canned or smoked fish. Whole eggs or egg yolks. Chicken or turkey with skin. Dairy Whole or 2% milk, cream, and half-and-half. Whole or full-fat cream cheese. Whole-fat or sweetened yogurt. Full-fat cheese. Nondairy creamers. Whipped toppings.  Processed cheese and cheese spreads. Fats and oils Butter. Stick margarine. Lard. Shortening. Ghee. Bacon fat. Tropical oils, such as coconut, palm kernel, or palm oil. Seasoning and other foods Salted popcorn and pretzels. Onion salt, garlic salt, seasoned salt, table salt, and sea salt. Worcestershire sauce. Tartar sauce. Barbecue sauce. Teriyaki sauce. Soy sauce, including reduced-sodium. Steak sauce. Canned and packaged gravies. Fish sauce. Oyster sauce. Cocktail sauce. Horseradish that you find on the shelf. Ketchup. Mustard. Meat flavorings and tenderizers. Bouillon cubes. Hot sauce and Tabasco sauce. Premade or packaged marinades. Premade or packaged taco seasonings. Relishes. Regular salad dressings. Where to find more information:  National Heart, Lung, and Blood Institute: www.nhlbi.nih.gov  American Heart Association: www.heart.org Summary  The DASH eating plan is a healthy eating plan that has been shown to reduce high blood pressure (hypertension). It may also reduce your risk for type 2 diabetes, heart disease, and stroke.  With the DASH eating plan, you should limit salt (sodium) intake to 2,300 mg a day. If you have hypertension, you may need to reduce your sodium intake to 1,500 mg a day.  When on the DASH eating plan, aim to eat more fresh fruits and vegetables, whole grains, lean proteins, low-fat dairy, and heart-healthy fats.  Work with your health care provider or diet and nutrition specialist (dietitian) to adjust your eating plan to your individual   calorie needs. This information is not intended to replace advice given to you by your health care provider. Make sure you discuss any questions you have with your health care provider. Document Released: 03/10/2011 Document Revised: 03/14/2016 Document Reviewed: 03/14/2016 Elsevier Interactive Patient Education  2018 Elsevier Inc.  

## 2018-06-20 ENCOUNTER — Telehealth: Payer: Self-pay | Admitting: Nurse Practitioner

## 2018-06-20 NOTE — Telephone Encounter (Signed)
Patients call returned.  Patient identified by name and date of birth. Patient had a concern about her blood pressure/  Patient states her BP is 135/92.  Patient was advised that her BP was elevated but not critical.  Patient has been compliant with her medication.  Patient advised that if she got a BP's above 855 systolic to call back and Nurse would see about getting a sooner appointment than next Wednesday.

## 2018-06-20 NOTE — Telephone Encounter (Signed)
Patient wants to talk about BP

## 2018-06-20 NOTE — Telephone Encounter (Signed)
Agree with recommendations.  

## 2018-06-26 ENCOUNTER — Other Ambulatory Visit: Payer: Self-pay | Admitting: Nurse Practitioner

## 2018-06-26 ENCOUNTER — Other Ambulatory Visit: Payer: Self-pay

## 2018-06-26 DIAGNOSIS — I10 Essential (primary) hypertension: Secondary | ICD-10-CM

## 2018-06-26 DIAGNOSIS — J4520 Mild intermittent asthma, uncomplicated: Secondary | ICD-10-CM

## 2018-06-26 MED ORDER — ALBUTEROL SULFATE HFA 108 (90 BASE) MCG/ACT IN AERS: 2.0000 | INHALATION_SPRAY | Freq: Four times a day (QID) | RESPIRATORY_TRACT | 1 refills | Status: DC | PRN

## 2018-06-26 MED ORDER — LISINOPRIL-HYDROCHLOROTHIAZIDE 10-12.5 MG PO TABS: 1.0000 | ORAL_TABLET | Freq: Every day | ORAL | 0 refills | Status: DC

## 2018-06-26 NOTE — Telephone Encounter (Signed)
Pt. Is asking for refill to [facility]:  lisinopril-hydrochlorothiazide (PRINZIDE,ZESTORETIC) 10-12.5 MG tablet   albuterol (PROVENTIL HFA;VENTOLIN HFA) 108 (90 Base) MCG/ACT inhaler

## 2018-06-27 ENCOUNTER — Ambulatory Visit: Payer: Managed Care, Other (non HMO) | Admitting: Nurse Practitioner

## 2018-07-02 MED ORDER — LISINOPRIL-HYDROCHLOROTHIAZIDE 10-12.5 MG PO TABS: 1.0000 | ORAL_TABLET | Freq: Every day | ORAL | 0 refills | Status: DC

## 2018-07-02 MED ORDER — ALBUTEROL SULFATE HFA 108 (90 BASE) MCG/ACT IN AERS: 2.0000 | INHALATION_SPRAY | Freq: Four times a day (QID) | RESPIRATORY_TRACT | 1 refills | Status: DC | PRN

## 2018-09-13 ENCOUNTER — Other Ambulatory Visit: Payer: Self-pay | Admitting: Nurse Practitioner

## 2018-09-13 DIAGNOSIS — Z1231 Encounter for screening mammogram for malignant neoplasm of breast: Secondary | ICD-10-CM

## 2018-09-26 ENCOUNTER — Encounter: Payer: Self-pay | Admitting: Nurse Practitioner

## 2018-09-26 ENCOUNTER — Other Ambulatory Visit: Payer: Self-pay

## 2018-09-26 ENCOUNTER — Ambulatory Visit: Payer: Managed Care, Other (non HMO) | Attending: Nurse Practitioner | Admitting: Nurse Practitioner

## 2018-09-26 VITALS — BP 124/85 | HR 98 | Temp 98.6°F | Ht 60.0 in | Wt 171.0 lb

## 2018-09-26 DIAGNOSIS — R232 Flushing: Secondary | ICD-10-CM

## 2018-09-26 DIAGNOSIS — I10 Essential (primary) hypertension: Secondary | ICD-10-CM

## 2018-09-26 DIAGNOSIS — E010 Iodine-deficiency related diffuse (endemic) goiter: Secondary | ICD-10-CM

## 2018-09-26 MED ORDER — BLOOD PRESSURE MONITOR DEVI: 0 refills | Status: AC

## 2018-09-26 MED ORDER — LISINOPRIL-HYDROCHLOROTHIAZIDE 10-12.5 MG PO TABS: 1.0000 | ORAL_TABLET | Freq: Every day | ORAL | 1 refills | Status: DC

## 2018-09-26 NOTE — Progress Notes (Signed)
Assessment & Plan:  [name] was seen today for hypertension.  Diagnoses and all orders for this visit:  Essential hypertension -     Basic metabolic panel -     CBC -     Lipid panel -     Blood Pressure Monitor DEVI; Please provide patient with insurance approved blood pressure monitor -     lisinopril-hydrochlorothiazide (ZESTORETIC) 10-12.5 MG tablet; Take 1 tablet by mouth daily. Patient will pick up scripts today. Continue all antihypertensives as prescribed.  Remember to bring in your blood pressure log with you for your follow up appointment.  DASH/Mediterranean Diets are healthier choices for HTN.    Thyromegaly -     US THYROID; Future  Hot flashes Taking black kohosh She denies any family history of breast cancer. We discussed the research on black kohosh and its relation to breast cancer. She verbalized understanding   Patient has been counseled on age-appropriate routine health concerns for screening and prevention. These are reviewed and up-to-date. Referrals have been placed accordingly. Immunizations are up-to-date or declined.    Subjective:   Chief Complaint  Patient presents with  . Hypertension    Pt. is here for blood pressure check and medication refills.    HPI [name] 48 y.o. female presents to office today for HTN.  Essential Hypertension Blood pressure is well controlled on lisinopril-hctz 10-12.5 mg daily. She does not monitor her blood pressure at home as she does not have a bp monitor. Will order one through her insurance today. She does not monitor her sodium intake. We did did discuss dietary and exercise modifications today. Denies chest pain, shortness of breath, palpitations, lightheadedness, dizziness, headaches or BLE edema.  BP Readings from Last 3 Encounters:  09/26/18 124/85  09/25/17 117/82  06/21/17 133/87     Review of Systems  Constitutional: Negative for fever, malaise/fatigue and weight loss.       Hot  flashes  HENT: Negative.  Negative for nosebleeds.   Eyes: Negative.  Negative for blurred vision, double vision and photophobia.  Respiratory: Negative.  Negative for cough and shortness of breath.   Cardiovascular: Negative.  Negative for chest pain, palpitations and leg swelling.  Gastrointestinal: Negative.  Negative for heartburn, nausea and vomiting.  Musculoskeletal: Negative.  Negative for myalgias.  Neurological: Negative.  Negative for dizziness, focal weakness, seizures and headaches.  Psychiatric/Behavioral: Negative.  Negative for suicidal ideas.    Past Medical History:  Diagnosis Date  . Asthma 1990  . Hypertension    2005    Past Surgical History:  Procedure Laterality Date  . CHOLECYSTECTOMY    . left eye surgery       Family History  Problem Relation Age of Onset  . Heart disease Father   . Hypertension Mother   . Diabetes Maternal Grandfather     Social History Reviewed with no changes to be made today.   Outpatient Medications Prior to Visit  Medication Sig Dispense Refill  . albuterol (PROVENTIL HFA;VENTOLIN HFA) 108 (90 Base) MCG/ACT inhaler Inhale 2 puffs into the lungs every 6 (six) hours as needed for wheezing or shortness of breath. 1 Inhaler 1  . BLACK COHOSH EXTRACT PO Take 545 mg by mouth.    Marland Kitchen ibuprofen (ADVIL,MOTRIN) 200 MG tablet Take 200-400 mg by mouth every 6 (six) hours as needed for mild pain or moderate pain.    Marland Kitchen lisinopril-hydrochlorothiazide (PRINZIDE,ZESTORETIC) 10-12.5 MG tablet Take 1 tablet by mouth daily. 90 tablet 0  No facility-administered medications prior to visit.     No Known Allergies     Objective:    BP 124/85 (BP Location: Right Arm, Patient Position: Sitting, Cuff Size: Large)   Pulse 98   Temp 98.6 F (37 C) (Oral)   Ht 5' (1.524 m)   Wt 171 lb (77.6 kg)   LMP 09/25/2018   SpO2 95%   BMI 33.40 kg/m  Wt Readings from Last 3 Encounters:  09/26/18 171 lb (77.6 kg)  09/25/17 159 lb (72.1 kg)  06/21/17  156 lb 12.8 oz (71.1 kg)    Physical Exam Vitals signs and nursing note reviewed.  Constitutional:      Appearance: She is well-developed.  HENT:     Head: Normocephalic and atraumatic.  Neck:     Musculoskeletal: Normal range of motion.     Thyroid: Thyromegaly present. No thyroid mass or thyroid tenderness.   Cardiovascular:     Rate and Rhythm: Normal rate and regular rhythm.     Heart sounds: Normal heart sounds. No murmur. No friction rub. No gallop.   Pulmonary:     Effort: Pulmonary effort is normal. No tachypnea or respiratory distress.     Breath sounds: Normal breath sounds. No decreased breath sounds, wheezing, rhonchi or rales.  Chest:     Chest wall: No tenderness.  Abdominal:     General: Bowel sounds are normal.     Palpations: Abdomen is soft.  Musculoskeletal: Normal range of motion.  Skin:    General: Skin is warm and dry.  Neurological:     Mental Status: She is alert and oriented to person, place, and time.     Coordination: Coordination normal.  Psychiatric:        Behavior: Behavior normal. Behavior is cooperative.        Thought Content: Thought content normal.        Judgment: Judgment normal.        Patient has been counseled extensively about nutrition and exercise as well as the importance of adherence with medications and regular follow-up. The patient was given clear instructions to go to ER or return to medical center if symptoms don't improve, worsen or new problems develop. The patient verbalized understanding.   Follow-up: Return in about 6 months (around 03/28/2019) for BP recheck.   [name], FNP-BC [facility] [place], [place]   09/26/2018, 10:00 AM

## 2018-09-27 LAB — CBC
Hematocrit: 38.5 % (ref 34.0–46.6)
Hemoglobin: 12.4 g/dL (ref 11.1–15.9)
MCH: 29.7 pg (ref 26.6–33.0)
MCHC: 32.2 g/dL (ref 31.5–35.7)
MCV: 92 fL (ref 79–97)
Platelets: 330 10*3/uL (ref 150–450)
RBC: 4.17 x10E6/uL (ref 3.77–5.28)
RDW: 14.5 % (ref 11.7–15.4)
WBC: 7.7 10*3/uL (ref 3.4–10.8)

## 2018-09-27 LAB — BASIC METABOLIC PANEL
BUN/Creatinine Ratio: 17 (ref 9–23)
BUN: 13 mg/dL (ref 6–24)
CO2: 22 mmol/L (ref 20–29)
Calcium: 9.1 mg/dL (ref 8.7–10.2)
Chloride: 105 mmol/L (ref 96–106)
Creatinine, Ser: 0.76 mg/dL (ref 0.57–1.00)
GFR calc Af Amer: 109 mL/min/{1.73_m2} (ref >59)
GFR calc non Af Amer: 94 mL/min/{1.73_m2} (ref >59)
Glucose: 109 mg/dL — ABNORMAL HIGH (ref 65–99)
Potassium: 3.8 mmol/L (ref 3.5–5.2)
Sodium: 139 mmol/L (ref 134–144)

## 2018-09-27 LAB — LIPID PANEL
Chol/HDL Ratio: 3.8 ratio (ref 0.0–4.4)
Cholesterol, Total: 167 mg/dL (ref 100–199)
HDL: 44 mg/dL (ref >39)
LDL Calculated: 103 mg/dL — ABNORMAL HIGH (ref 0–99)
Triglycerides: 99 mg/dL (ref 0–149)
VLDL Cholesterol Cal: 20 mg/dL (ref 5–40)

## 2018-10-09 ENCOUNTER — Telehealth: Payer: Self-pay

## 2018-10-09 NOTE — Telephone Encounter (Signed)
-----   Message from [name], NP sent at 10/04/2018 11:23 PM EDT ----- Kidney function and cholesterol levels are essentially normal. Glucose does not show any diabetes. There is no anemia.

## 2018-10-09 NOTE — Telephone Encounter (Signed)
CMA attempt to reach patient to inform on lab results.  No answer and left a VM to call back.

## 2018-10-26 ENCOUNTER — Telehealth: Payer: Self-pay | Admitting: Nurse Practitioner

## 2018-10-31 ENCOUNTER — Ambulatory Visit
Admission: RE | Admit: 2018-10-31 | Discharge: 2018-10-31 | Disposition: A | Discharge status: Home / Self Care | Payer: Managed Care, Other (non HMO) | Source: Ambulatory Visit | Attending: Nurse Practitioner | Admitting: Nurse Practitioner

## 2018-10-31 ENCOUNTER — Other Ambulatory Visit: Payer: Self-pay

## 2018-10-31 DIAGNOSIS — Z1231 Encounter for screening mammogram for malignant neoplasm of breast: Secondary | ICD-10-CM

## 2019-01-21 ENCOUNTER — Telehealth: Payer: Self-pay | Admitting: Nurse Practitioner

## 2019-01-21 DIAGNOSIS — J4520 Mild intermittent asthma, uncomplicated: Secondary | ICD-10-CM

## 2019-01-21 MED ORDER — ALBUTEROL SULFATE HFA 108 (90 BASE) MCG/ACT IN AERS: 2.0000 | INHALATION_SPRAY | Freq: Four times a day (QID) | RESPIRATORY_TRACT | 0 refills | Status: DC | PRN

## 2019-01-21 NOTE — Telephone Encounter (Signed)
1) Medication(s) Requested (by name): -albuterol (PROVENTIL HFA;VENTOLIN HFA) 108 (90 Base) MCG/ACT inhaler   2) Pharmacy of Choice: -[id number] - [place], [place]

## 2019-03-19 ENCOUNTER — Encounter: Payer: Self-pay | Admitting: Nurse Practitioner

## 2019-03-19 ENCOUNTER — Other Ambulatory Visit: Payer: Self-pay

## 2019-03-19 ENCOUNTER — Ambulatory Visit: Payer: Managed Care, Other (non HMO) | Attending: Nurse Practitioner | Admitting: Nurse Practitioner

## 2019-03-19 DIAGNOSIS — J4520 Mild intermittent asthma, uncomplicated: Secondary | ICD-10-CM

## 2019-03-19 DIAGNOSIS — I10 Essential (primary) hypertension: Secondary | ICD-10-CM

## 2019-03-19 MED ORDER — LISINOPRIL-HYDROCHLOROTHIAZIDE 10-12.5 MG PO TABS: 1.0000 | ORAL_TABLET | Freq: Every day | ORAL | 1 refills | Status: DC

## 2019-03-19 MED ORDER — ALBUTEROL SULFATE HFA 108 (90 BASE) MCG/ACT IN AERS: 2.0000 | INHALATION_SPRAY | Freq: Four times a day (QID) | RESPIRATORY_TRACT | 1 refills | Status: DC | PRN

## 2019-03-19 NOTE — Progress Notes (Signed)
Virtual Visit via Telephone Note Due to national recommendations of social distancing due to [place] 19, telehealth visit is felt to be most appropriate for this patient at this time.  I discussed the limitations, risks, security and privacy concerns of performing an evaluation and management service by telephone and the availability of in person appointments. I also discussed with the patient that there may be a patient responsible charge related to this service. The patient expressed understanding and agreed to proceed.    I connected with [name] on 03/19/19  at   2:30 PM EST  EDT by telephone and verified that I am speaking with the correct person using two identifiers.   Consent I discussed the limitations, risks, security and privacy concerns of performing an evaluation and management service by telephone and the availability of in person appointments. I also discussed with the patient that there may be a patient responsible charge related to this service. The patient expressed understanding and agreed to proceed.   Location of Patient: Private Residence   Location of Provider: [place] and [place] participating in Telemedicine visit: [name] FNP-BC [place]    History of Present Illness: Telemedicine visit for: HTN  Essential Hypertension Taking lisinopril-hctz 10-12.5 mg daily. She does have a blood pressure monitor at home but has not checked her blood pressure in several months. Denies chest pain, shortness of breath, palpitations, lightheadedness, dizziness, headaches or BLE edema. She does not exercise but stays active on her job. Not consistent with DASH diet.  BP Readings from Last 3 Encounters:  09/26/18 124/85  09/25/17 117/82  06/21/17 133/87    Past Medical History:  Diagnosis Date  . Asthma 1990  . Hypertension    2005    Past Surgical History:  Procedure Laterality Date  .  CHOLECYSTECTOMY    . left eye surgery       Family History  Problem Relation Age of Onset  . Heart disease Father   . Hypertension Mother   . Diabetes Maternal Grandfather     Social History   Socioeconomic History  . Marital status: Married    Spouse name: Not on file  . Number of children: Not on file  . Years of education: Not on file  . Highest education level: Not on file  Occupational History  . Not on file  Tobacco Use  . Smoking status: Never Smoker  . Smokeless tobacco: Never Used  Substance and Sexual Activity  . Alcohol use: No    Alcohol/week: 0.0 standard drinks  . Drug use: No  . Sexual activity: Yes  Other Topics Concern  . Not on file  Social History Narrative  . Not on file   Social Determinants of Health   Financial Resource Strain:   . Difficulty of Paying Living Expenses: Not on file  Food Insecurity:   . Worried About Charity fundraiser in the Last Year: Not on file  . Ran Out of Food in the Last Year: Not on file  Transportation Needs:   . Lack of Transportation (Medical): Not on file  . Lack of Transportation (Non-Medical): Not on file  Physical Activity:   . Days of Exercise per Week: Not on file  . Minutes of Exercise per Session: Not on file  Stress:   . Feeling of Stress : Not on file  Social Connections:   . Frequency of Communication with Friends and Family: Not on  file  . Frequency of Social Gatherings with Friends and Family: Not on file  . Attends Religious Services: Not on file  . Active Member of Clubs or Organizations: Not on file  . Attends Archivist Meetings: Not on file  . Marital Status: Not on file     Observations/Objective: Awake, alert and oriented x 3   Review of Systems  Constitutional: Negative for fever, malaise/fatigue and weight loss.  HENT: Negative.  Negative for nosebleeds.   Eyes: Negative.  Negative for blurred vision, double vision and photophobia.  Respiratory: Negative.  Negative for  cough and shortness of breath.   Cardiovascular: Negative.  Negative for chest pain, palpitations and leg swelling.  Gastrointestinal: Negative.  Negative for heartburn, nausea and vomiting.  Musculoskeletal: Negative.  Negative for myalgias.  Neurological: Negative.  Negative for dizziness, focal weakness, seizures and headaches.  Psychiatric/Behavioral: Negative.  Negative for suicidal ideas.    Assessment and Plan: [name] was seen today for follow-up.  Diagnoses and all orders for this visit:  Essential hypertension -     lisinopril-hydrochlorothiazide (ZESTORETIC) 10-12.5 MG tablet; Take 1 tablet by mouth daily.  Mild intermittent asthma without complication -     albuterol (VENTOLIN HFA) 108 (90 Base) MCG/ACT inhaler; Inhale 2 puffs into the lungs every 6 (six) hours as needed for wheezing or shortness of breath. WELL CONTROLLED: Denies cough, wheezing or Shortness of breath   Follow Up Instructions No follow-ups on file.     I discussed the assessment and treatment plan with the patient. The patient was provided an opportunity to ask questions and all were answered. The patient agreed with the plan and demonstrated an understanding of the instructions.   The patient was advised to call back or seek an in-person evaluation if the symptoms worsen or if the condition fails to improve as anticipated.  I provided 18 minutes of non-face-to-face time during this encounter including median intraservice time, reviewing previous notes, labs, imaging, medications and explaining diagnosis and management.  [name], FNP-BC

## 2019-03-26 ENCOUNTER — Ambulatory Visit: Payer: Managed Care, Other (non HMO) | Attending: Nurse Practitioner

## 2019-03-26 ENCOUNTER — Other Ambulatory Visit: Payer: Self-pay

## 2019-03-26 DIAGNOSIS — I10 Essential (primary) hypertension: Secondary | ICD-10-CM

## 2019-03-27 ENCOUNTER — Telehealth: Payer: Self-pay

## 2019-03-27 LAB — BASIC METABOLIC PANEL
BUN/Creatinine Ratio: 29 — ABNORMAL HIGH (ref 9–23)
BUN: 17 mg/dL (ref 6–24)
CO2: 21 mmol/L (ref 20–29)
Calcium: 9.9 mg/dL (ref 8.7–10.2)
Chloride: 103 mmol/L (ref 96–106)
Creatinine, Ser: 0.59 mg/dL (ref 0.57–1.00)
GFR calc Af Amer: 127 mL/min/{1.73_m2} (ref >59)
GFR calc non Af Amer: 110 mL/min/{1.73_m2} (ref >59)
Glucose: 89 mg/dL (ref 65–99)
Potassium: 3.9 mmol/L (ref 3.5–5.2)
Sodium: 140 mmol/L (ref 134–144)

## 2019-03-27 NOTE — Telephone Encounter (Signed)
-----   Message from [name], NP sent at 03/27/2019  9:58 AM EST ----- Kidney function and electrolytes are normal

## 2019-03-27 NOTE — Telephone Encounter (Signed)
Patient name and DOB has been verified Patient was informed of lab results. Patient had no questions.  

## 2019-06-01 ENCOUNTER — Ambulatory Visit: Payer: Managed Care, Other (non HMO) | Attending: Internal Medicine

## 2019-06-01 DIAGNOSIS — Z23 Encounter for immunization: Secondary | ICD-10-CM | POA: Insufficient documentation

## 2019-06-01 NOTE — Progress Notes (Signed)
   Covid-19 Vaccination Clinic  Name:  [name]    MRN: [id number] DOB: Feb 25, 1971  06/01/2019  [name] was observed post Covid-19 immunization for 15 minutes without incidence. She was provided with Vaccine Information Sheet and instruction to access the V-Safe system.   [name] was instructed to call 911 with any severe reactions post vaccine: Marland Kitchen Difficulty breathing  . Swelling of your face and throat  . A fast heartbeat  . A bad rash all over your body  . Dizziness and weakness    Immunizations Administered    Name Date Dose VIS Date Route   Pfizer COVID-19 Vaccine 06/01/2019  6:18 PM 0.3 mL 03/15/2019 Intramuscular   Manufacturer: [place]   Lot: [id number]   [place]: [id number]

## 2019-06-22 ENCOUNTER — Ambulatory Visit: Payer: Managed Care, Other (non HMO) | Attending: Internal Medicine

## 2019-06-22 DIAGNOSIS — Z23 Encounter for immunization: Secondary | ICD-10-CM

## 2019-06-22 NOTE — Progress Notes (Signed)
   Covid-19 Vaccination Clinic  Name:  [name]    MRN: [id number] DOB: May 26, 1970  06/22/2019  Ms. Budnick was observed post Covid-19 immunization for 15 minutes without incident. She was provided with Vaccine Information Sheet and instruction to access the V-Safe system.   Ms. Pilarczyk was instructed to call 911 with any severe reactions post vaccine: Marland Kitchen Difficulty breathing  . Swelling of face and throat  . A fast heartbeat  . A bad rash all over body  . Dizziness and weakness   Immunizations Administered    Name Date Dose VIS Date Route   Pfizer COVID-19 Vaccine 06/22/2019  9:25 AM 0.3 mL 03/15/2019 Intramuscular   Manufacturer: [place]   Lot: [id number]   [place]: [id number]

## 2019-08-10 ENCOUNTER — Other Ambulatory Visit: Payer: Self-pay | Admitting: Nurse Practitioner

## 2019-08-10 DIAGNOSIS — I10 Essential (primary) hypertension: Secondary | ICD-10-CM

## 2019-10-10 ENCOUNTER — Other Ambulatory Visit: Payer: Self-pay | Admitting: Nurse Practitioner

## 2019-10-10 DIAGNOSIS — J4520 Mild intermittent asthma, uncomplicated: Secondary | ICD-10-CM

## 2019-10-29 ENCOUNTER — Other Ambulatory Visit: Payer: Self-pay | Admitting: Nurse Practitioner

## 2019-10-29 DIAGNOSIS — I10 Essential (primary) hypertension: Secondary | ICD-10-CM

## 2019-10-29 MED ORDER — LISINOPRIL-HYDROCHLOROTHIAZIDE 10-12.5 MG PO TABS: 1.0000 | ORAL_TABLET | Freq: Every day | ORAL | 0 refills | Status: DC

## 2019-10-29 NOTE — Telephone Encounter (Signed)
Medication Refill - Medication: lisinopril-hydrochlorothiazide (ZESTORETIC) 10-12.5 MG tablet    Has the patient contacted their pharmacy? yes (Agent: If no, request that the patient contact the pharmacy for the refill.) (Agent: If yes, when and what did the pharmacy advise?)contact pcp  Preferred Pharmacy (with phone number or street name):  [id number] [address], [address] Phone:  [phone]  Fax:  [phone]       Agent: Please be advised that RX refills may take up to 3 business days. We ask that you follow-up with your pharmacy.

## 2019-10-29 NOTE — Telephone Encounter (Signed)
Pt. Has appointment in [place] medication to get pt. To OV.

## 2019-11-13 ENCOUNTER — Encounter: Payer: Self-pay | Admitting: Nurse Practitioner

## 2019-11-13 ENCOUNTER — Ambulatory Visit: Payer: Managed Care, Other (non HMO) | Attending: Nurse Practitioner | Admitting: Nurse Practitioner

## 2019-11-13 ENCOUNTER — Other Ambulatory Visit: Payer: Self-pay

## 2019-11-13 VITALS — BP 113/80 | HR 99 | Temp 97.7°F | Ht 60.0 in | Wt 173.0 lb

## 2019-11-13 DIAGNOSIS — R7989 Other specified abnormal findings of blood chemistry: Secondary | ICD-10-CM

## 2019-11-13 DIAGNOSIS — Z1159 Encounter for screening for other viral diseases: Secondary | ICD-10-CM

## 2019-11-13 DIAGNOSIS — I10 Essential (primary) hypertension: Secondary | ICD-10-CM

## 2019-11-13 DIAGNOSIS — R232 Flushing: Secondary | ICD-10-CM | POA: Diagnosis not present

## 2019-11-13 DIAGNOSIS — E669 Obesity, unspecified: Secondary | ICD-10-CM | POA: Diagnosis not present

## 2019-11-13 MED ORDER — LISINOPRIL-HYDROCHLOROTHIAZIDE 10-12.5 MG PO TABS: 1.0000 | ORAL_TABLET | Freq: Every day | ORAL | 1 refills | Status: DC

## 2019-11-13 NOTE — Progress Notes (Signed)
Assessment & Plan:  [name] was seen today for blood pressure check.  Diagnoses and all orders for this visit:  Essential hypertension -     lisinopril-hydrochlorothiazide (ZESTORETIC) 10-12.5 MG tablet; Take 1 tablet by mouth daily.  Need for hepatitis C screening test -     Hepatitis C Antibody  Obesity (BMI 30-39.9) -     Hemoglobin A1c -     CMP14+EGFR -     Lipid panel  Hot flashes -     CBC  Abnormal CBC -     CBC    Patient has been counseled on age-appropriate routine health concerns for screening and prevention. These are reviewed and up-to-date. Referrals have been placed accordingly. Immunizations are up-to-date or declined.    Subjective:   Chief Complaint  Patient presents with  . Blood Pressure Check    Pt. is here for blood pressure check.    HPI [name] 49 y.o. female presents to office today for HTN follow up. Doing well today with no complaints or concerns.   Review of Systems  Constitutional: Negative for fever, malaise/fatigue and weight loss.  HENT: Negative.  Negative for nosebleeds.   Eyes: Negative.  Negative for blurred vision, double vision and photophobia.  Respiratory: Negative.  Negative for cough and shortness of breath.   Cardiovascular: Negative.  Negative for chest pain, palpitations and leg swelling.  Gastrointestinal: Negative.  Negative for heartburn, nausea and vomiting.  Musculoskeletal: Negative.  Negative for myalgias.  Neurological: Negative.  Negative for dizziness, focal weakness, seizures and headaches.  Psychiatric/Behavioral: Negative.  Negative for suicidal ideas.    Past Medical History:  Diagnosis Date  . Asthma 1990  . Hypertension    2005    Past Surgical History:  Procedure Laterality Date  . CHOLECYSTECTOMY    . left eye surgery       Family History  Problem Relation Age of Onset  . Heart disease Father   . Hypertension Mother   . Diabetes Maternal Grandfather     Social History  Reviewed with no changes to be made today.   Outpatient Medications Prior to Visit  Medication Sig Dispense Refill  . albuterol (VENTOLIN HFA) 108 (90 Base) MCG/ACT inhaler TAKE 2 PUFFS BY MOUTH EVERY 6 HOURS AS NEEDED FOR WHEEZE OR SHORTNESS OF BREATH **INS REQ PROAIR 8.5 g 1  . Blood Pressure Monitor DEVI Please provide patient with insurance approved blood pressure monitor 1 Device 0  . ibuprofen (ADVIL,MOTRIN) 200 MG tablet Take 200-400 mg by mouth every 6 (six) hours as needed for mild pain or moderate pain.    Marland Kitchen lisinopril-hydrochlorothiazide (ZESTORETIC) 10-12.5 MG tablet Take 1 tablet by mouth daily. Must have office visit for refills 30 tablet 0  . BLACK COHOSH EXTRACT PO Take 545 mg by mouth. (Patient not taking: Reported on 11/13/2019)     No facility-administered medications prior to visit.    No Known Allergies     Objective:    BP 113/80 (BP Location: Left Arm, Patient Position: Sitting, Cuff Size: Normal)   Pulse 99   Temp 97.7 F (36.5 C) (Temporal)   Ht 5' (1.524 m)   Wt 173 lb (78.5 kg)   LMP 11/03/2019   SpO2 98%   BMI 33.79 kg/m  Wt Readings from Last 3 Encounters:  11/13/19 173 lb (78.5 kg)  09/26/18 171 lb (77.6 kg)  09/25/17 159 lb (72.1 kg)    Physical Exam Vitals and nursing note reviewed.  Constitutional:  Appearance: She is well-developed.  HENT:     Head: Normocephalic and atraumatic.  Cardiovascular:     Rate and Rhythm: Normal rate and regular rhythm.     Heart sounds: Normal heart sounds. No murmur heard.  No friction rub. No gallop.   Pulmonary:     Effort: Pulmonary effort is normal. No tachypnea or respiratory distress.     Breath sounds: Normal breath sounds. No decreased breath sounds, wheezing, rhonchi or rales.  Chest:     Chest wall: No tenderness.  Abdominal:     General: Bowel sounds are normal.     Palpations: Abdomen is soft.  Musculoskeletal:        General: Normal range of motion.     Cervical back: Normal range of  motion.  Skin:    General: Skin is warm and dry.  Neurological:     Mental Status: She is alert and oriented to person, place, and time.     Coordination: Coordination normal.  Psychiatric:        Behavior: Behavior normal. Behavior is cooperative.        Thought Content: Thought content normal.        Judgment: Judgment normal.          Patient has been counseled extensively about nutrition and exercise as well as the importance of adherence with medications and regular follow-up. The patient was given clear instructions to go to ER or return to medical center if symptoms don't improve, worsen or new problems develop. The patient verbalized understanding.   Follow-up: Return in about 6 months (around 05/15/2020).   [name], FNP-BC [facility] and [place], [place]   11/13/2019, 2:33 PM

## 2019-11-14 LAB — CMP14+EGFR
ALT: 12 IU/L (ref 0–32)
AST: 16 IU/L (ref 0–40)
Albumin/Globulin Ratio: 1.7 (ref 1.2–2.2)
Albumin: 4.5 g/dL (ref 3.8–4.8)
Alkaline Phosphatase: 60 IU/L (ref 48–121)
BUN/Creatinine Ratio: 19 (ref 9–23)
BUN: 14 mg/dL (ref 6–24)
Bilirubin Total: 0.3 mg/dL (ref 0.0–1.2)
CO2: 24 mmol/L (ref 20–29)
Calcium: 9.4 mg/dL (ref 8.7–10.2)
Chloride: 102 mmol/L (ref 96–106)
Creatinine, Ser: 0.72 mg/dL (ref 0.57–1.00)
GFR calc Af Amer: 115 mL/min/{1.73_m2} (ref >59)
GFR calc non Af Amer: 100 mL/min/{1.73_m2} (ref >59)
Globulin, Total: 2.6 g/dL (ref 1.5–4.5)
Glucose: 100 mg/dL — ABNORMAL HIGH (ref 65–99)
Potassium: 3.9 mmol/L (ref 3.5–5.2)
Sodium: 140 mmol/L (ref 134–144)
Total Protein: 7.1 g/dL (ref 6.0–8.5)

## 2019-11-14 LAB — CBC
Hematocrit: 38.1 % (ref 34.0–46.6)
Hemoglobin: 12.6 g/dL (ref 11.1–15.9)
MCH: 30.1 pg (ref 26.6–33.0)
MCHC: 33.1 g/dL (ref 31.5–35.7)
MCV: 91 fL (ref 79–97)
Platelets: 302 10*3/uL (ref 150–450)
RBC: 4.19 x10E6/uL (ref 3.77–5.28)
RDW: 15.1 % (ref 11.7–15.4)
WBC: 8 10*3/uL (ref 3.4–10.8)

## 2019-11-14 LAB — HEMOGLOBIN A1C
Est. average glucose Bld gHb Est-mCnc: 120 mg/dL
Hgb A1c MFr Bld: 5.8 % — ABNORMAL HIGH (ref 4.8–5.6)

## 2019-11-14 LAB — LIPID PANEL
Chol/HDL Ratio: 3.9 ratio (ref 0.0–4.4)
Cholesterol, Total: 178 mg/dL (ref 100–199)
HDL: 46 mg/dL (ref >39)
LDL Chol Calc (NIH): 107 mg/dL — ABNORMAL HIGH (ref 0–99)
Triglycerides: 144 mg/dL (ref 0–149)
VLDL Cholesterol Cal: 25 mg/dL (ref 5–40)

## 2019-11-14 LAB — HEPATITIS C ANTIBODY: Hep C Virus Ab: <0.1 s/co ratio (ref 0.0–0.9)

## 2019-11-29 ENCOUNTER — Other Ambulatory Visit: Payer: Self-pay | Admitting: Nurse Practitioner

## 2019-11-29 ENCOUNTER — Telehealth: Payer: Self-pay | Admitting: Nurse Practitioner

## 2019-11-29 DIAGNOSIS — Z1231 Encounter for screening mammogram for malignant neoplasm of breast: Secondary | ICD-10-CM

## 2019-11-29 NOTE — Telephone Encounter (Signed)
Patient called in and requested for lab results. Patient was identified by 2 patient identifiers. Results were given, patient had no further questions or concerns at this time.

## 2019-12-02 ENCOUNTER — Ambulatory Visit
Admission: RE | Admit: 2019-12-02 | Discharge: 2019-12-02 | Disposition: A | Discharge status: Home / Self Care | Payer: Managed Care, Other (non HMO) | Source: Ambulatory Visit | Attending: Nurse Practitioner | Admitting: Nurse Practitioner

## 2019-12-02 ENCOUNTER — Other Ambulatory Visit: Payer: Self-pay

## 2019-12-02 DIAGNOSIS — Z1231 Encounter for screening mammogram for malignant neoplasm of breast: Secondary | ICD-10-CM

## 2020-06-09 ENCOUNTER — Other Ambulatory Visit: Payer: Self-pay | Admitting: Nurse Practitioner

## 2020-06-09 DIAGNOSIS — I10 Essential (primary) hypertension: Secondary | ICD-10-CM

## 2020-06-09 NOTE — Telephone Encounter (Signed)
   Notes to clinic: Patient is schedule for appointment on 08/12/2020 Review for refill until that appointment    Requested Prescriptions  Pending Prescriptions Disp Refills   lisinopril-hydrochlorothiazide (ZESTORETIC) 10-12.5 MG tablet 90 tablet 1    Sig: Take 1 tablet by mouth daily.      Cardiovascular:  ACEI + Diuretic Combos Failed - 06/09/2020 10:31 AM      Failed - Na in normal range and within 180 days    Sodium  Date Value Ref Range Status  11/13/2019 140 134 - 144 mmol/L Final          Failed - K in normal range and within 180 days    Potassium  Date Value Ref Range Status  11/13/2019 3.9 3.5 - 5.2 mmol/L Final          Failed - Cr in normal range and within 180 days    Creat  Date Value Ref Range Status  06/07/2016 0.75 0.50 - 1.10 mg/dL Final   Creatinine, Ser  Date Value Ref Range Status  11/13/2019 0.72 0.57 - 1.00 mg/dL Final          Failed - Ca in normal range and within 180 days    Calcium  Date Value Ref Range Status  11/13/2019 9.4 8.7 - 10.2 mg/dL Final          Failed - Valid encounter within last 6 months    Recent Outpatient Visits           6 months ago Essential hypertension   [place], Vernia Buff, NP   1 year ago Essential hypertension   [place], Zelda W, NP   1 year ago Essential hypertension   [place], Vernia Buff, NP   2 years ago Essential hypertension   [place], Vernia Buff, NP   2 years ago Well woman exam with routine gynecological exam   [place] [name], NP       Future Appointments             In 2 months [name], NP [place] - Patient is not pregnant      Passed - Last BP in normal range    BP Readings from Last 1 Encounters:  11/13/19 113/80

## 2020-06-09 NOTE — Telephone Encounter (Signed)
Copied from [place] [id number]. Topic: Quick Communication - Rx Refill/Question >> Jun 09, 2020 10:28 AM Tessa Lerner A wrote: Medication: lisinopril-hydrochlorothiazide (ZESTORETIC) 10-12.5 MG tablet  Has the patient contacted their pharmacy? No, patient was uncertain about refills. Patient was directed to contact pharmacy for future refill requests  Preferred Pharmacy (with phone number or street name): [id number] [address], [address]  Phone:  [phone]  Agent: Please be advised that RX refills may take up to 3 business days. We ask that you follow-up with your pharmacy.

## 2020-06-10 MED ORDER — LISINOPRIL-HYDROCHLOROTHIAZIDE 10-12.5 MG PO TABS: 1.0000 | ORAL_TABLET | Freq: Every day | ORAL | 0 refills | Status: DC

## 2020-08-10 ENCOUNTER — Telehealth: Payer: Self-pay | Admitting: Nurse Practitioner

## 2020-08-10 NOTE — Telephone Encounter (Signed)
Provider Raul Del working virtual 5/11. Called Patient no answer. Left vm for Pt to call [phone] to reschedule physical for another day because it has to be in person

## 2020-08-12 ENCOUNTER — Encounter: Payer: Managed Care, Other (non HMO) | Admitting: Nurse Practitioner

## 2020-09-26 ENCOUNTER — Other Ambulatory Visit: Payer: Self-pay | Admitting: Family Medicine

## 2020-09-26 ENCOUNTER — Other Ambulatory Visit: Payer: Self-pay | Admitting: Nurse Practitioner

## 2020-09-26 DIAGNOSIS — I10 Essential (primary) hypertension: Secondary | ICD-10-CM

## 2020-09-26 DIAGNOSIS — J4520 Mild intermittent asthma, uncomplicated: Secondary | ICD-10-CM

## 2020-10-06 ENCOUNTER — Encounter: Payer: Managed Care, Other (non HMO) | Admitting: Nurse Practitioner

## 2020-10-07 ENCOUNTER — Encounter: Payer: Self-pay | Admitting: Nurse Practitioner

## 2020-10-07 ENCOUNTER — Other Ambulatory Visit: Payer: Self-pay

## 2020-10-07 ENCOUNTER — Ambulatory Visit: Payer: Managed Care, Other (non HMO) | Attending: Nurse Practitioner | Admitting: Nurse Practitioner

## 2020-10-07 ENCOUNTER — Other Ambulatory Visit (HOSPITAL_COMMUNITY)
Admission: RE | Admit: 2020-10-07 | Discharge: 2020-10-07 | Disposition: A | Discharge status: Home / Self Care | Payer: Managed Care, Other (non HMO) | Source: Ambulatory Visit | Attending: Nurse Practitioner | Admitting: Nurse Practitioner

## 2020-10-07 VITALS — BP 123/90 | HR 83 | Temp 98.7°F | Resp 16 | Wt 165.0 lb

## 2020-10-07 DIAGNOSIS — E669 Obesity, unspecified: Secondary | ICD-10-CM

## 2020-10-07 DIAGNOSIS — R7303 Prediabetes: Secondary | ICD-10-CM

## 2020-10-07 DIAGNOSIS — Z1211 Encounter for screening for malignant neoplasm of colon: Secondary | ICD-10-CM

## 2020-10-07 DIAGNOSIS — K8010 Calculus of gallbladder with chronic cholecystitis without obstruction: Secondary | ICD-10-CM | POA: Diagnosis not present

## 2020-10-07 DIAGNOSIS — Z124 Encounter for screening for malignant neoplasm of cervix: Secondary | ICD-10-CM | POA: Diagnosis not present

## 2020-10-07 DIAGNOSIS — Z113 Encounter for screening for infections with a predominantly sexual mode of transmission: Secondary | ICD-10-CM | POA: Diagnosis not present

## 2020-10-07 DIAGNOSIS — I10 Essential (primary) hypertension: Secondary | ICD-10-CM

## 2020-10-07 DIAGNOSIS — Z01419 Encounter for gynecological examination (general) (routine) without abnormal findings: Secondary | ICD-10-CM | POA: Insufficient documentation

## 2020-10-07 DIAGNOSIS — Z1231 Encounter for screening mammogram for malignant neoplasm of breast: Secondary | ICD-10-CM

## 2020-10-07 DIAGNOSIS — R7989 Other specified abnormal findings of blood chemistry: Secondary | ICD-10-CM

## 2020-10-07 NOTE — Progress Notes (Signed)
Assessment & Plan:  [name] was seen today for gynecologic exam.  Diagnoses and all orders for this visit:  Pap smear for cervical cancer screening -     Cytology - PAP([place]) -     Cervicovaginal ancillary only  Breast cancer screening by mammogram -     MM DIGITAL SCREENING BILATERAL; Future -     MM 3D SCREEN BREAST BILATERAL; Future  Prediabetes -     CMP14+EGFR -     Hemoglobin A1c Well controlled.  Lab Results  Component Value Date   HGBA1C 5.8 (H) 11/13/2019     Colon cancer screening -     Ambulatory referral to Gastroenterology   Essential hypertension -     CMP14+EGFR Continue all antihypertensives as prescribed.  Remember to bring in your blood pressure log with you for your follow up appointment.  DASH/Mediterranean Diets are healthier choices for HTN.    Obesity (BMI 30-39.9) -     Lipid panel  Abnormal CBC -     CBC   Patient has been counseled on age-appropriate routine health concerns for screening and prevention. These are reviewed and up-to-date. Referrals have been placed accordingly. Immunizations are up-to-date or declined.    Subjective:   Chief Complaint  Patient presents with   Gynecologic Exam   HPI [name] 50 y.o. female presents to office today for pap smear.  Denies chest pain, shortness of breath, palpitations, lightheadedness, dizziness, headaches or BLE edema.    Review of Systems  Constitutional: Negative.  Negative for chills, fever, malaise/fatigue and weight loss.  Respiratory: Negative.  Negative for cough, shortness of breath and wheezing.   Cardiovascular: Negative.  Negative for chest pain, orthopnea and leg swelling.  Gastrointestinal:  Negative for abdominal pain.  Genitourinary: Negative.  Negative for flank pain.  Skin: Negative.  Negative for rash.  Psychiatric/Behavioral:  Negative for suicidal ideas.    Past Medical History:  Diagnosis Date   Asthma 1990   Hypertension    2005     Past Surgical History:  Procedure Laterality Date   CHOLECYSTECTOMY     left eye surgery       Family History  Problem Relation Age of Onset   Heart disease Father    Hypertension Mother    Diabetes Maternal Grandfather     Social History Reviewed with no changes to be made today.   Outpatient Medications Prior to Visit  Medication Sig Dispense Refill   BLACK COHOSH EXTRACT PO Take 545 mg by mouth.     Blood Pressure Monitor DEVI Please provide patient with insurance approved blood pressure monitor 1 Device 0   ibuprofen (ADVIL,MOTRIN) 200 MG tablet Take 200-400 mg by mouth every 6 (six) hours as needed for mild pain or moderate pain.     lisinopril-hydrochlorothiazide (ZESTORETIC) 10-12.5 MG tablet TAKE 1 TABLET BY MOUTH EVERY DAY 30 tablet 0   PROAIR HFA 108 (90 Base) MCG/ACT inhaler TAKE 2 PUFFS BY MOUTH EVERY 6 HOURS AS NEEDED FOR WHEEZE OR SHORTNESS OF BREATH **INS REQ PROAIR 8.5 each 0   No facility-administered medications prior to visit.    No Known Allergies     Objective:    BP 123/90 (BP Location: Left Arm, Patient Position: Sitting, Cuff Size: Large)   Pulse 83   Temp 98.7 F (37.1 C)   Resp 16   Wt 165 lb (74.8 kg)   SpO2 100%   BMI 32.22 kg/m  Wt Readings from Last 3 Encounters:  10/07/20 165 lb (74.8 kg)  11/13/19 173 lb (78.5 kg)  09/26/18 171 lb (77.6 kg)    Physical Exam Exam conducted with a chaperone present.  Constitutional:      Appearance: She is well-developed.  HENT:     Head: Normocephalic.  Cardiovascular:     Rate and Rhythm: Normal rate and regular rhythm.     Heart sounds: Normal heart sounds.  Pulmonary:     Effort: Pulmonary effort is normal.     Breath sounds: Normal breath sounds.  Abdominal:     General: Bowel sounds are normal.     Palpations: Abdomen is soft.     Hernia: There is no hernia in the left inguinal area.  Genitourinary:    Labia:        Right: No rash, tenderness, lesion or injury.        Left: No  rash, tenderness, lesion or injury.      Vagina: Normal. No signs of injury and foreign body. No vaginal discharge, erythema, tenderness or bleeding.     Cervix: Normal.     Uterus: Normal. Not deviated and not enlarged.      Adnexa:        Right: No mass, tenderness or fullness.         Left: No mass, tenderness or fullness.       Rectum: Normal. No external hemorrhoid.  Lymphadenopathy:     Lower Body: No right inguinal adenopathy. No left inguinal adenopathy.  Skin:    General: Skin is warm and dry.  Neurological:     Mental Status: She is alert and oriented to person, place, and time.  Psychiatric:        Behavior: Behavior normal.        Thought Content: Thought content normal.        Judgment: Judgment normal.         Patient has been counseled extensively about nutrition and exercise as well as the importance of adherence with medications and regular follow-up. The patient was given clear instructions to go to ER or return to medical center if symptoms don't improve, worsen or new problems develop. The patient verbalized understanding.   Follow-up: Return in about 6 months (around 04/09/2021).   [name], FNP-BC [facility] and [place], [place]   10/07/2020, 1:51 PM

## 2020-10-07 NOTE — Progress Notes (Signed)
PAP

## 2020-10-08 LAB — CBC
Hematocrit: 41 % (ref 34.0–46.6)
Hemoglobin: 13.3 g/dL (ref 11.1–15.9)
MCH: 29.4 pg (ref 26.6–33.0)
MCHC: 32.4 g/dL (ref 31.5–35.7)
MCV: 91 fL (ref 79–97)
Platelets: 329 10*3/uL (ref 150–450)
RBC: 4.52 x10E6/uL (ref 3.77–5.28)
RDW: 14.3 % (ref 11.7–15.4)
WBC: 6.3 10*3/uL (ref 3.4–10.8)

## 2020-10-08 LAB — CERVICOVAGINAL ANCILLARY ONLY
Bacterial Vaginitis (gardnerella): NEGATIVE
Candida Glabrata: NEGATIVE
Candida Vaginitis: NEGATIVE
Comment: NEGATIVE
Comment: NEGATIVE
Comment: NEGATIVE
Comment: NEGATIVE
Trichomonas: NEGATIVE

## 2020-10-08 LAB — CMP14+EGFR
ALT: 9 IU/L (ref 0–32)
AST: 11 IU/L (ref 0–40)
Albumin/Globulin Ratio: 1.5 (ref 1.2–2.2)
Albumin: 4.4 g/dL (ref 3.8–4.8)
Alkaline Phosphatase: 61 IU/L (ref 44–121)
BUN/Creatinine Ratio: 19 (ref 9–23)
BUN: 14 mg/dL (ref 6–24)
Bilirubin Total: <0.2 mg/dL (ref 0.0–1.2)
CO2: 22 mmol/L (ref 20–29)
Calcium: 9.4 mg/dL (ref 8.7–10.2)
Chloride: 102 mmol/L (ref 96–106)
Creatinine, Ser: 0.75 mg/dL (ref 0.57–1.00)
Globulin, Total: 3 g/dL (ref 1.5–4.5)
Glucose: 100 mg/dL — ABNORMAL HIGH (ref 65–99)
Potassium: 4 mmol/L (ref 3.5–5.2)
Sodium: 138 mmol/L (ref 134–144)
Total Protein: 7.4 g/dL (ref 6.0–8.5)
eGFR: 98 mL/min/{1.73_m2} (ref >59)

## 2020-10-08 LAB — LIPID PANEL
Chol/HDL Ratio: 4.1 ratio (ref 0.0–4.4)
Cholesterol, Total: 203 mg/dL — ABNORMAL HIGH (ref 100–199)
HDL: 49 mg/dL (ref >39)
LDL Chol Calc (NIH): 135 mg/dL — ABNORMAL HIGH (ref 0–99)
Triglycerides: 108 mg/dL (ref 0–149)
VLDL Cholesterol Cal: 19 mg/dL (ref 5–40)

## 2020-10-08 LAB — HEMOGLOBIN A1C
Est. average glucose Bld gHb Est-mCnc: 120 mg/dL
Hgb A1c MFr Bld: 5.8 % — ABNORMAL HIGH (ref 4.8–5.6)

## 2020-10-13 LAB — CYTOLOGY - PAP
Chlamydia: NEGATIVE
Comment: NEGATIVE
Comment: NEGATIVE
Comment: NEGATIVE
Comment: NEGATIVE
Comment: NORMAL
Diagnosis: NEGATIVE
HSV1: NEGATIVE
HSV2: NEGATIVE
High risk HPV: NEGATIVE
Neisseria Gonorrhea: NEGATIVE
Trichomonas: NEGATIVE

## 2020-10-16 ENCOUNTER — Telehealth: Payer: Self-pay

## 2020-10-16 NOTE — Telephone Encounter (Signed)
Patient was called and a voicemail was left informing patient to return phone call for lab results. 

## 2020-10-16 NOTE — Telephone Encounter (Signed)
-----   Message from [name], NP sent at 10/13/2020  9:28 PM EDT ----- Normal Pap smear and vaginal swab. CBC does not indicate any anemia or bleeding disorders. Cholesterol levels slightly elevated. INSTRUCTIONS: Work on a low fat, heart healthy diet and participate in regular aerobic exercise program by working out at least 150 minutes per week; 5 days a week-30 minutes per day. Avoid red meat/beef/steak,  fried foods. junk foods, sodas, sugary drinks, unhealthy snacking, alcohol and smoking.  Drink at least 80 oz of water per day and monitor your carbohydrate intake daily. Kidney, liver function and electrolytes are normal.

## 2020-10-19 ENCOUNTER — Other Ambulatory Visit: Payer: Self-pay | Admitting: Nurse Practitioner

## 2020-10-19 DIAGNOSIS — I10 Essential (primary) hypertension: Secondary | ICD-10-CM

## 2020-10-22 ENCOUNTER — Other Ambulatory Visit: Payer: Self-pay | Admitting: Nurse Practitioner

## 2020-10-22 DIAGNOSIS — J4520 Mild intermittent asthma, uncomplicated: Secondary | ICD-10-CM

## 2020-10-22 NOTE — Telephone Encounter (Signed)
Requested medication (s) are due for refill today: no  Requested medication (s) are on the active medication list: yes  Last refill:  09/26/2020  Future visit scheduled: no  Notes to clinic:  Failed protocol:  One inhaler should last at least one month. If the patient is requesting refills earlier, contact the patient to check for uncontrolled symptoms  Requested Prescriptions  Pending Prescriptions Disp Refills   albuterol (VENTOLIN HFA) 108 (90 Base) MCG/ACT inhaler [Pharmacy Med Name: ALBUTEROL HFA (PROAIR) INHALER] 8.5 each 0    Sig: TAKE 2 PUFFS BY MOUTH EVERY 6 HOURS AS NEEDED FOR WHEEZE OR SHORTNESS OF BREATH **INS REQ PROAIR      Pulmonology:  Beta Agonists Failed - 10/22/2020  4:15 AM      Failed - One inhaler should last at least one month. If the patient is requesting refills earlier, contact the patient to check for uncontrolled symptoms.      Passed - Valid encounter within last 12 months    Recent Outpatient Visits           2 weeks ago Pap smear for cervical cancer screening   [place], Vernia Buff, NP   11 months ago Essential hypertension   [place], Vernia Buff, NP   1 year ago Essential hypertension   [place], Vernia Buff, NP   2 years ago Essential hypertension   [place], Vernia Buff, NP   3 years ago Essential hypertension   [place], Vernia Buff, NP

## 2020-10-23 MED ORDER — ALBUTEROL SULFATE HFA 108 (90 BASE) MCG/ACT IN AERS: 2.0000 | INHALATION_SPRAY | Freq: Four times a day (QID) | RESPIRATORY_TRACT | 2 refills | Status: DC | PRN

## 2020-11-14 ENCOUNTER — Other Ambulatory Visit: Payer: Self-pay | Admitting: Nurse Practitioner

## 2020-11-14 DIAGNOSIS — I10 Essential (primary) hypertension: Secondary | ICD-10-CM

## 2020-11-14 NOTE — Telephone Encounter (Signed)
Requested Prescriptions  Pending Prescriptions Disp Refills  . lisinopril-hydrochlorothiazide (ZESTORETIC) 10-12.5 MG tablet [Pharmacy Med Name: LISINOPRIL-HCTZ 10-12.5 MG TAB] 90 tablet 1    Sig: TAKE 1 TABLET BY MOUTH EVERY DAY     Cardiovascular:  ACEI + Diuretic Combos Failed - 11/14/2020 10:35 AM      Failed - Last BP in normal range    BP Readings from Last 1 Encounters:  10/07/20 123/90         Passed - Na in normal range and within 180 days    Sodium  Date Value Ref Range Status  10/07/2020 138 134 - 144 mmol/L Final         Passed - K in normal range and within 180 days    Potassium  Date Value Ref Range Status  10/07/2020 4.0 3.5 - 5.2 mmol/L Final         Passed - Cr in normal range and within 180 days    Creat  Date Value Ref Range Status  06/07/2016 0.75 0.50 - 1.10 mg/dL Final   Creatinine, Ser  Date Value Ref Range Status  10/07/2020 0.75 0.57 - 1.00 mg/dL Final         Passed - Ca in normal range and within 180 days    Calcium  Date Value Ref Range Status  10/07/2020 9.4 8.7 - 10.2 mg/dL Final         Passed - Patient is not pregnant      Passed - Valid encounter within last 6 months    Recent Outpatient Visits          1 month ago Pap smear for cervical cancer screening   [place], Vernia Buff, NP   1 year ago Essential hypertension   [place], Vernia Buff, NP   1 year ago Essential hypertension   [place], Vernia Buff, NP   2 years ago Essential hypertension   [place], Vernia Buff, NP   3 years ago Essential hypertension   [place], Vernia Buff, NP

## 2020-12-04 ENCOUNTER — Other Ambulatory Visit: Payer: Self-pay

## 2020-12-04 ENCOUNTER — Ambulatory Visit
Admission: RE | Admit: 2020-12-04 | Discharge: 2020-12-04 | Disposition: A | Discharge status: Home / Self Care | Payer: Managed Care, Other (non HMO) | Source: Ambulatory Visit | Attending: Nurse Practitioner | Admitting: Nurse Practitioner

## 2020-12-04 DIAGNOSIS — Z1231 Encounter for screening mammogram for malignant neoplasm of breast: Secondary | ICD-10-CM

## 2021-05-25 ENCOUNTER — Other Ambulatory Visit: Payer: Self-pay | Admitting: Nurse Practitioner

## 2021-05-25 DIAGNOSIS — I10 Essential (primary) hypertension: Secondary | ICD-10-CM

## 2021-05-26 NOTE — Telephone Encounter (Signed)
Requested medication (s) are due for refill today: Yes  Requested medication (s) are on the active medication list: Yes  Last refill:  11/14/20  Future visit scheduled: No  Notes to clinic:  Left message to call and make appointment.    Requested Prescriptions  Pending Prescriptions Disp Refills   lisinopril-hydrochlorothiazide (ZESTORETIC) 10-12.5 MG tablet [Pharmacy Med Name: LISINOPRIL-HCTZ 10-12.5 MG TAB] 90 tablet 1    Sig: TAKE 1 TABLET BY MOUTH EVERY DAY     Cardiovascular:  ACEI + Diuretic Combos Failed - 05/25/2021  9:18 PM      Failed - Na in normal range and within 180 days    Sodium  Date Value Ref Range Status  10/07/2020 138 134 - 144 mmol/L Final          Failed - K in normal range and within 180 days    Potassium  Date Value Ref Range Status  10/07/2020 4.0 3.5 - 5.2 mmol/L Final          Failed - Cr in normal range and within 180 days    Creat  Date Value Ref Range Status  06/07/2016 0.75 0.50 - 1.10 mg/dL Final   Creatinine, Ser  Date Value Ref Range Status  10/07/2020 0.75 0.57 - 1.00 mg/dL Final          Failed - eGFR is 30 or above and within 180 days    GFR, Est African American  Date Value Ref Range Status  11/23/2015 75 >=60 mL/min Final   GFR calc Af Amer  Date Value Ref Range Status  11/13/2019 115 >59 mL/min/1.73 Final    Comment:    **[facility] currently reports eGFR in compliance with the current**   recommendations of the [employer]. [facility] will   update reporting as new guidelines are published from the NKF-ASN   Task force.    GFR, Est Non African American  Date Value Ref Range Status  11/23/2015 65 >=60 mL/min Final   GFR calc non Af Amer  Date Value Ref Range Status  11/13/2019 100 >59 mL/min/1.73 Final   eGFR  Date Value Ref Range Status  10/07/2020 98 >59 mL/min/1.73 Final          Failed - Last BP in normal range    BP Readings from Last 1 Encounters:  10/07/20 123/90          Failed -  Valid encounter within last 6 months    Recent Outpatient Visits           7 months ago Pap smear for cervical cancer screening   [place] [name], NP   1 year ago Essential hypertension   [place], Vernia Buff, NP   2 years ago Essential hypertension   [place], Vernia Buff, NP   2 years ago Essential hypertension   [place], Vernia Buff, NP   3 years ago Essential hypertension   [place], Vernia Buff, NP              Passed - Patient is not pregnant

## 2021-07-21 ENCOUNTER — Other Ambulatory Visit (HOSPITAL_COMMUNITY)
Admission: RE | Admit: 2021-07-21 | Discharge: 2021-07-21 | Disposition: A | Discharge status: Home / Self Care | Payer: Managed Care, Other (non HMO) | Source: Ambulatory Visit | Attending: Nurse Practitioner | Admitting: Nurse Practitioner

## 2021-07-21 ENCOUNTER — Ambulatory Visit: Payer: Managed Care, Other (non HMO) | Attending: Nurse Practitioner | Admitting: Nurse Practitioner

## 2021-07-21 VITALS — BP 132/92 | HR 83 | Wt 159.8 lb

## 2021-07-21 DIAGNOSIS — R7303 Prediabetes: Secondary | ICD-10-CM

## 2021-07-21 DIAGNOSIS — N760 Acute vaginitis: Secondary | ICD-10-CM | POA: Insufficient documentation

## 2021-07-21 DIAGNOSIS — I10 Essential (primary) hypertension: Secondary | ICD-10-CM

## 2021-07-21 DIAGNOSIS — E785 Hyperlipidemia, unspecified: Secondary | ICD-10-CM | POA: Diagnosis not present

## 2021-07-21 DIAGNOSIS — Z1211 Encounter for screening for malignant neoplasm of colon: Secondary | ICD-10-CM

## 2021-07-21 DIAGNOSIS — Z114 Encounter for screening for human immunodeficiency virus [HIV]: Secondary | ICD-10-CM

## 2021-07-21 DIAGNOSIS — R8290 Unspecified abnormal findings in urine: Secondary | ICD-10-CM

## 2021-07-21 NOTE — Patient Instructions (Addendum)
I want you take your blood pressure once a day at home. It does not matter what time of the day. ? ?Write down your blood pressure numbers and we will go over those numbers. ?We will schedule you for phone visit to go over your numbers in 2 weeks.  ? ? ?Your blood pressure should be less than 130/80. ? ? ? ? ?

## 2021-07-21 NOTE — Progress Notes (Signed)
? ?Assessment & Plan:  ?[name] was seen today for hypertension. ? ?Diagnoses and all orders for this visit: ? ?Essential hypertension ?Continue all antihypertensives as prescribed.  ?Remember to bring in your blood pressure log with you for your follow up appointment.  ?DASH/Mediterranean Diets are healthier choices for HTN.   ? ?Acute vaginitis ?-     Cervicovaginal ancillary only ? ?Colon cancer screening ?-     Ambulatory referral to Gastroenterology ? ?Prediabetes ?-     CMP14+EGFR ?-     Hemoglobin A1c ? ?Abnormal urine odor ?-     Urinalysis, Complete ?-     Microscopic Examination ? ?Dyslipidemia, goal LDL below 70 ?-     Lipid panel ? ?Encounter for screening for HIV ?-     HIV antibody (with reflex) ? ? ? ?Patient has been counseled on age-appropriate routine health concerns for screening and prevention. These are reviewed and up-to-date. Referrals have been placed accordingly. Immunizations are up-to-date or declined.    ?Subjective:  ? ?Chief Complaint  ?Patient presents with  ? Hypertension  ? ?HPI ?[name] 51 y.o. female presents to office today for HTN. ?He has a past medical history of Asthma (1990) and Hypertension.  ? ?HTN ?Blood pressure is elevated today. She endorses adherence taking zestoretic 10-12.5 mg daily as prescribed. Endorses increased stress at work. If blood pressure continues elevated we will need to increase zestoretic to 20-12.5 mg daily ?BP Readings from Last 3 Encounters:  ?07/21/21 (!) 132/92  ?10/07/20 123/90  ?11/13/19 113/80  ?  ? ?Prediabetes ?Well controlled with diet only at this time. Lipids not at goal. Recommend low fat low cholesterol diet.  ?Lab Results  ?Component Value Date  ? HGBA1C 5.7 (H) 07/21/2021  ? ?Lab Results  ?Component Value Date  ? LDLCALC 141 (H) 07/21/2021  ?  The 10-year ASCVD risk score (Arnett DK, et al., 2019) is: 3.8% ?  Values used to calculate the score: ?    Age: 51 years ?    Sex: Female ?    Is Non-Hispanic African American:  Yes ?    Diabetic: No ?    Tobacco smoker: No ?    Systolic Blood Pressure: 132 mmHg ?    Is BP treated: Yes ?    HDL Cholesterol: 55 mg/dL ?    Total Cholesterol: 209 mg/dL  ? ?Vaginitis: Patient complains of an abnormal vaginal discharge for a few weeks. Vaginal symptoms include urinary symptoms of malodorous urine and malodorous vaginal discharge .STI Risk: Very low risk of STD exposureDischarge described as: thick.Other associated symptoms: none. ?  ?Review of Systems  ?Constitutional:  Negative for fever, malaise/fatigue and weight loss.  ?HENT: Negative.  Negative for nosebleeds.   ?Eyes: Negative.  Negative for blurred vision, double vision and photophobia.  ?Respiratory: Negative.  Negative for cough and shortness of breath.   ?Cardiovascular: Negative.  Negative for chest pain, palpitations and leg swelling.  ?Gastrointestinal: Negative.  Negative for heartburn, nausea and vomiting.  ?Genitourinary:  Positive for dysuria. Negative for flank pain, frequency, hematuria and urgency.  ?     SEE HPI  ?Musculoskeletal: Negative.  Negative for myalgias.  ?Neurological: Negative.  Negative for dizziness, focal weakness, seizures and headaches.  ?Psychiatric/Behavioral: Negative.  Negative for suicidal ideas.   ? ?Past Medical History:  ?Diagnosis Date  ? Asthma 1990  ? Hypertension   ? 2005  ? ? ?Past Surgical History:  ?Procedure Laterality Date  ? CHOLECYSTECTOMY    ?   left eye surgery     ? ? ?Family History  ?Problem Relation Age of Onset  ? Heart disease Father   ? Hypertension Mother   ? Diabetes Maternal Grandfather   ? ? ?Social History Reviewed with no changes to be made today.  ? ?Outpatient Medications Prior to Visit  ?Medication Sig Dispense Refill  ? lisinopril-hydrochlorothiazide (ZESTORETIC) 10-12.5 MG tablet TAKE 1 TABLET BY MOUTH EVERY DAY 90 tablet 0  ? albuterol (PROAIR HFA) 108 (90 Base) MCG/ACT inhaler Inhale 2 puffs into the lungs every 6 (six) hours as needed for wheezing or shortness of  breath. 8.5 g 2  ? BLACK COHOSH EXTRACT PO Take 545 mg by mouth.    ? Blood Pressure Monitor DEVI Please provide patient with insurance approved blood pressure monitor 1 Device 0  ? ibuprofen (ADVIL,MOTRIN) 200 MG tablet Take 200-400 mg by mouth every 6 (six) hours as needed for mild pain or moderate pain.    ? PROAIR HFA 108 (90 Base) MCG/ACT inhaler TAKE 2 PUFFS BY MOUTH EVERY 6 HOURS AS NEEDED FOR WHEEZE OR SHORTNESS OF BREATH **INS REQ PROAIR 8.5 each 0  ? ?No facility-administered medications prior to visit.  ? ? ?No Known Allergies ? ?   ?Objective:  ?  ?BP (!) 132/92   Pulse 83   Wt 159 lb 12.8 oz (72.5 kg)   SpO2 97%   BMI 31.21 kg/m?  ?Wt Readings from Last 3 Encounters:  ?07/21/21 159 lb 12.8 oz (72.5 kg)  ?10/07/20 165 lb (74.8 kg)  ?11/13/19 173 lb (78.5 kg)  ? ? ?Physical Exam ?Vitals and nursing note reviewed.  ?Constitutional:   ?   Appearance: She is well-developed.  ?HENT:  ?   Head: Normocephalic and atraumatic.  ?Cardiovascular:  ?   Rate and Rhythm: Normal rate and regular rhythm.  ?   Heart sounds: Normal heart sounds. No murmur heard. ?  No friction rub. No gallop.  ?Pulmonary:  ?   Effort: Pulmonary effort is normal. No tachypnea or respiratory distress.  ?   Breath sounds: Normal breath sounds. No decreased breath sounds, wheezing, rhonchi or rales.  ?Chest:  ?   Chest wall: No tenderness.  ?Abdominal:  ?   General: Bowel sounds are normal.  ?   Palpations: Abdomen is soft.  ?Musculoskeletal:     ?   General: Normal range of motion.  ?   Cervical back: Normal range of motion.  ?Skin: ?   General: Skin is warm and dry.  ?Neurological:  ?   Mental Status: She is alert and oriented to person, place, and time.  ?   Coordination: Coordination normal.  ?Psychiatric:     ?   Behavior: Behavior normal. Behavior is cooperative.     ?   Thought Content: Thought content normal.     ?   Judgment: Judgment normal.  ? ? ? ? ?   ?Patient has been counseled extensively about nutrition and exercise as  well as the importance of adherence with medications and regular follow-up. The patient was given clear instructions to go to ER or return to medical center if symptoms don't improve, worsen or new problems develop. The patient verbalized understanding.  ? ?Follow-up: Return for virtual tuesday in 2 weeks for BP CHECK.  ? ? W , FNP-BC ?[facility] ?[place], [place] ?[phone]   ?07/22/2021, 8:20 PM ?

## 2021-07-22 ENCOUNTER — Encounter: Payer: Self-pay | Admitting: Nurse Practitioner

## 2021-07-22 LAB — HEMOGLOBIN A1C
Est. average glucose Bld gHb Est-mCnc: 117 mg/dL
Hgb A1c MFr Bld: 5.7 % — ABNORMAL HIGH (ref 4.8–5.6)

## 2021-07-22 LAB — MICROSCOPIC EXAMINATION
Casts: NONE SEEN /lpf
WBC, UA: NONE SEEN /hpf (ref 0–5)

## 2021-07-22 LAB — CMP14+EGFR
ALT: 11 IU/L (ref 0–32)
AST: 18 IU/L (ref 0–40)
Albumin/Globulin Ratio: 1.7 (ref 1.2–2.2)
Albumin: 4.6 g/dL (ref 3.8–4.8)
Alkaline Phosphatase: 62 IU/L (ref 44–121)
BUN/Creatinine Ratio: 20 (ref 9–23)
BUN: 15 mg/dL (ref 6–24)
Bilirubin Total: 0.3 mg/dL (ref 0.0–1.2)
CO2: 24 mmol/L (ref 20–29)
Calcium: 9.6 mg/dL (ref 8.7–10.2)
Chloride: 103 mmol/L (ref 96–106)
Creatinine, Ser: 0.75 mg/dL (ref 0.57–1.00)
Globulin, Total: 2.7 g/dL (ref 1.5–4.5)
Glucose: 93 mg/dL (ref 70–99)
Potassium: 4.1 mmol/L (ref 3.5–5.2)
Sodium: 139 mmol/L (ref 134–144)
Total Protein: 7.3 g/dL (ref 6.0–8.5)
eGFR: 98 mL/min/{1.73_m2} (ref >59)

## 2021-07-22 LAB — URINALYSIS, COMPLETE
Bilirubin, UA: NEGATIVE
Glucose, UA: NEGATIVE
Ketones, UA: NEGATIVE
Leukocytes,UA: NEGATIVE
Nitrite, UA: NEGATIVE
Protein,UA: NEGATIVE
RBC, UA: NEGATIVE
Specific Gravity, UA: 1.021 (ref 1.005–1.030)
Urobilinogen, Ur: 0.2 mg/dL (ref 0.2–1.0)
pH, UA: 5.5 (ref 5.0–7.5)

## 2021-07-22 LAB — CERVICOVAGINAL ANCILLARY ONLY
Bacterial Vaginitis (gardnerella): NEGATIVE
Candida Glabrata: NEGATIVE
Candida Vaginitis: NEGATIVE
Chlamydia: NEGATIVE
Comment: NEGATIVE
Comment: NEGATIVE
Comment: NEGATIVE
Comment: NEGATIVE
Comment: NEGATIVE
Comment: NORMAL
Neisseria Gonorrhea: NEGATIVE
Trichomonas: NEGATIVE

## 2021-07-22 LAB — LIPID PANEL
Chol/HDL Ratio: 3.8 ratio (ref 0.0–4.4)
Cholesterol, Total: 209 mg/dL — ABNORMAL HIGH (ref 100–199)
HDL: 55 mg/dL (ref >39)
LDL Chol Calc (NIH): 141 mg/dL — ABNORMAL HIGH (ref 0–99)
Triglycerides: 72 mg/dL (ref 0–149)
VLDL Cholesterol Cal: 13 mg/dL (ref 5–40)

## 2021-07-22 LAB — HIV ANTIBODY (ROUTINE TESTING W REFLEX): HIV Screen 4th Generation wRfx: NONREACTIVE

## 2021-08-10 ENCOUNTER — Encounter: Payer: Self-pay | Admitting: Nurse Practitioner

## 2021-08-10 ENCOUNTER — Telehealth (HOSPITAL_BASED_OUTPATIENT_CLINIC_OR_DEPARTMENT_OTHER): Payer: Managed Care, Other (non HMO) | Admitting: Nurse Practitioner

## 2021-08-10 DIAGNOSIS — I10 Essential (primary) hypertension: Secondary | ICD-10-CM | POA: Diagnosis not present

## 2021-08-10 NOTE — Progress Notes (Signed)
Virtual Visit via Telephone Note ? I discussed the limitations, risks, security and privacy concerns of performing an evaluation and management service by telephone and the availability of in person appointments. I also discussed with the patient that there may be a patient responsible charge related to this service. The patient expressed understanding and agreed to proceed.  ? ? ?I connected with [name] on 08/10/21  at  10:30 AM EDT  EDT by telephone and verified that I am speaking with the correct person using two identifiers. ? ?Location of Patient: ?Private Residence ?  ?Location of Provider: ?Scientist, research (physical sciences) and [employer] Office  ?  ?Persons participating in Telemedicine visit: ?Nichole Rankins FNP-BC ?[name] [name]  ?  ?History of Present Illness: ?Telemedicine visit for: HTN ? ?[name] was instructed a few weeks ago to monitor her blood pressure daily over the next 2 weeks and we would review her readings to decide if any changes with her blood pressure medication needed to be implemented.  Unfortunately she has not been monitoring her blood pressure and the only reading she has to review today is this morning's which is elevated 161-109 and 140/96 (she is also taking cold medication which may be causing an increase in her readings). She endorses adherence taking zestoretic 10-12.5 mg daily.  ?BP Readings from Last 3 Encounters:  ?07/21/21 (!) 132/92  ?10/07/20 123/90  ?11/13/19 113/80  ?  ? ? ? ? ?Past Medical History:  ?Diagnosis Date  ? Asthma 1990  ? Hypertension   ? 2005  ?  ?Past Surgical History:  ?Procedure Laterality Date  ? CHOLECYSTECTOMY    ? left eye surgery     ?  ?Family History  ?Problem Relation Age of Onset  ? Heart disease Father   ? Hypertension Mother   ? Diabetes Maternal Grandfather   ?  ?Social History  ? ?Socioeconomic History  ? Marital status: Married  ?  Spouse name: Not on file  ? Number of children: Not on file  ? Years of education: Not on file  ?  Highest education level: Not on file  ?Occupational History  ? Not on file  ?Tobacco Use  ? Smoking status: Never  ? Smokeless tobacco: Never  ?Vaping Use  ? Vaping Use: Never used  ?Substance and Sexual Activity  ? Alcohol use: No  ?  Alcohol/week: 0.0 standard drinks  ? Drug use: No  ? Sexual activity: Yes  ?Other Topics Concern  ? Not on file  ?Social History Narrative  ? Not on file  ? ?Social Determinants of Health  ? ?Financial Resource Strain: Not on file  ?Food Insecurity: Not on file  ?Transportation Needs: Not on file  ?Physical Activity: Not on file  ?Stress: Not on file  ?Social Connections: Not on file  ?  ? ?Observations/Objective: ?Awake, alert and oriented x 3 ? ? ?Review of Systems  ?Constitutional:  Negative for fever, malaise/fatigue and weight loss.  ?HENT: Negative.  Negative for nosebleeds.   ?Eyes: Negative.  Negative for blurred vision, double vision and photophobia.  ?Respiratory: Negative.  Negative for cough and shortness of breath.   ?Cardiovascular: Negative.  Negative for chest pain, palpitations and leg swelling.  ?Gastrointestinal: Negative.  Negative for heartburn, nausea and vomiting.  ?Musculoskeletal: Negative.  Negative for myalgias.  ?Neurological: Negative.  Negative for dizziness, focal weakness, seizures and headaches.  ?Psychiatric/Behavioral: Negative.  Negative for suicidal ideas.    ?Assessment and Plan: ?Diagnoses and all orders for this visit: ? ?  Primary hypertension ?Continue zestoretic as prescribed.  ?Remember to bring in your blood pressure log with you for all your follow up appointments  ?DASH/Mediterranean Diets are healthier choices for HTN.   ?Follow Up Instructions ?Return in about 2 weeks (around 08/24/2021) for virtual BP .  ? ?  ?I discussed the assessment and treatment plan with the patient. The patient was provided an opportunity to ask questions and all were answered. The patient agreed with the plan and demonstrated an understanding of the  instructions. ?  ?The patient was advised to call back or seek an in-person evaluation if the symptoms worsen or if the condition fails to improve as anticipated. ? ?I provided 8 minutes of non-face-to-face time during this encounter including median intraservice time, reviewing previous notes, labs, imaging, medications and explaining diagnosis and management. ? ?[name], FNP-BC  ?

## 2021-08-24 ENCOUNTER — Encounter: Payer: Self-pay | Admitting: Nurse Practitioner

## 2021-08-24 ENCOUNTER — Ambulatory Visit: Payer: Medicare Other | Attending: Nurse Practitioner | Admitting: Nurse Practitioner

## 2021-08-24 DIAGNOSIS — I10 Essential (primary) hypertension: Secondary | ICD-10-CM | POA: Diagnosis not present

## 2021-08-24 MED ORDER — LISINOPRIL-HYDROCHLOROTHIAZIDE 20-12.5 MG PO TABS: 1.0000 | ORAL_TABLET | Freq: Every day | ORAL | 3 refills | Status: DC

## 2021-08-24 NOTE — Progress Notes (Signed)
Virtual Visit via Telephone Note  I discussed the limitations, risks, security and privacy concerns of performing an evaluation and management service by telephone and the availability of in person appointments. I also discussed with the patient that there may be a patient responsible charge related to this service. The patient expressed understanding and agreed to proceed.    I connected with [name] on 08/24/21  at   9:10 AM EDT  EDT by telephone and verified that I am speaking with the correct person using two identifiers.  Location of Patient: Private Residence   Location of Provider: [place] and [place] participating in Telemedicine visit: [name] FNP-BC [place]    History of Present Illness: Telemedicine visit for: HTN  Notes past 3 blood pressure readings including today's: 133/90 129/87 131/91 Average 131/89.  At this time we will increase zestoretic to 20-12.5 mg daily.  BP Readings from Last 3 Encounters:  07/21/21 (!) 132/92  10/07/20 123/90  11/13/19 113/80      Past Medical History:  Diagnosis Date   Asthma 1990   Hypertension    2005    Past Surgical History:  Procedure Laterality Date   CHOLECYSTECTOMY     left eye surgery       Family History  Problem Relation Age of Onset   Heart disease Father    Hypertension Mother    Diabetes Maternal Grandfather     Social History   Socioeconomic History   Marital status: Married    Spouse name: Not on file   Number of children: Not on file   Years of education: Not on file   Highest education level: Not on file  Occupational History   Not on file  Tobacco Use   Smoking status: Never   Smokeless tobacco: Never  Vaping Use   Vaping Use: Never used  Substance and Sexual Activity   Alcohol use: No    Alcohol/week: 0.0 standard drinks   Drug use: No   Sexual activity: Yes  Other Topics Concern   Not on file  Social History  Narrative   Not on file   Social Determinants of Health   Financial Resource Strain: Not on file  Food Insecurity: Not on file  Transportation Needs: Not on file  Physical Activity: Not on file  Stress: Not on file  Social Connections: Not on file     Observations/Objective: Awake, alert and oriented x 3   Review of Systems  Constitutional:  Negative for fever, malaise/fatigue and weight loss.  HENT: Negative.  Negative for nosebleeds.   Eyes: Negative.  Negative for blurred vision, double vision and photophobia.  Respiratory: Negative.  Negative for cough and shortness of breath.   Cardiovascular: Negative.  Negative for chest pain, palpitations and leg swelling.  Gastrointestinal: Negative.  Negative for heartburn, nausea and vomiting.  Musculoskeletal: Negative.  Negative for myalgias.  Neurological: Negative.  Negative for dizziness, focal weakness, seizures and headaches.  Psychiatric/Behavioral: Negative.  Negative for suicidal ideas.    Assessment and Plan: Diagnoses and all orders for this visit:  Essential hypertension -     lisinopril-hydrochlorothiazide (ZESTORETIC) 20-12.5 MG tablet; Take 1 tablet by mouth daily.     Follow Up Instructions Return in about 3 months (around 11/24/2021).     I discussed the assessment and treatment plan with the patient. The patient was provided an opportunity to ask questions and all were answered. The patient agreed with the plan  and demonstrated an understanding of the instructions.   The patient was advised to call back or seek an in-person evaluation if the symptoms worsen or if the condition fails to improve as anticipated.  I provided 12 minutes of non-face-to-face time during this encounter including median intraservice time, reviewing previous notes, labs, imaging, medications and explaining diagnosis and management.  [name], FNP-BC

## 2021-10-01 ENCOUNTER — Encounter: Payer: Self-pay | Admitting: Gastroenterology

## 2021-11-02 ENCOUNTER — Other Ambulatory Visit: Payer: Self-pay | Admitting: Nurse Practitioner

## 2021-11-02 DIAGNOSIS — Z1231 Encounter for screening mammogram for malignant neoplasm of breast: Secondary | ICD-10-CM

## 2021-11-10 ENCOUNTER — Telehealth: Payer: Self-pay

## 2021-11-10 NOTE — Telephone Encounter (Signed)
Pt is returning [name]'s call. Per [name] advised pt she will call her back.

## 2021-11-10 NOTE — Telephone Encounter (Signed)
Contacted pt to schedule Medicare Wellness pt didn't answer lvm   °

## 2021-11-10 NOTE — Telephone Encounter (Signed)
Patient returned call back to schedule AWV.[name] Please call back

## 2021-11-12 ENCOUNTER — Ambulatory Visit (AMBULATORY_SURGERY_CENTER): Payer: Self-pay

## 2021-11-12 VITALS — Ht 60.0 in | Wt 164.0 lb

## 2021-11-12 DIAGNOSIS — Z1211 Encounter for screening for malignant neoplasm of colon: Secondary | ICD-10-CM

## 2021-11-12 MED ORDER — NA SULFATE-K SULFATE-MG SULF 17.5-3.13-1.6 GM/177ML PO SOLN: 1.0000 | Freq: Once | ORAL | 0 refills | Status: AC

## 2021-11-12 NOTE — Progress Notes (Signed)
No egg or soy allergy known to patient  No issues known to pt with past sedation with any surgeries or procedures Patient denies ever being told they had issues or difficulty with intubation  No FH of Malignant Hyperthermia Pt is not on diet pills Pt is not on home 02  Pt is not on blood thinners  Pt denies issues with constipation at this time;  No A fib or A flutter Have any cardiac testing pending--NO Pt instructed to use Single[employer]com or GoodRx for a price reduction on prep

## 2021-11-12 NOTE — Telephone Encounter (Signed)
Pt was called and scheduled for AWV for 11/16/2021

## 2021-11-16 ENCOUNTER — Ambulatory Visit: Payer: Managed Care, Other (non HMO)

## 2021-11-24 ENCOUNTER — Encounter: Payer: Self-pay | Admitting: Nurse Practitioner

## 2021-11-24 ENCOUNTER — Ambulatory Visit: Payer: Managed Care, Other (non HMO) | Attending: Nurse Practitioner | Admitting: Nurse Practitioner

## 2021-11-24 VITALS — BP 136/93 | HR 80 | Temp 97.9°F | Ht 60.0 in | Wt 165.0 lb

## 2021-11-24 DIAGNOSIS — I10 Essential (primary) hypertension: Secondary | ICD-10-CM | POA: Diagnosis not present

## 2021-11-24 DIAGNOSIS — L209 Atopic dermatitis, unspecified: Secondary | ICD-10-CM

## 2021-11-24 MED ORDER — VALSARTAN 40 MG PO TABS: 40.0000 mg | ORAL_TABLET | Freq: Every day | ORAL | 3 refills | Status: DC

## 2021-11-24 MED ORDER — TRIAMCINOLONE ACETONIDE 0.5 % EX OINT: 1.0000 | TOPICAL_OINTMENT | Freq: Two times a day (BID) | CUTANEOUS | 3 refills | Status: DC

## 2021-11-24 NOTE — Progress Notes (Signed)
Assessment & Plan:  [name] was seen today for hypertension.  Diagnoses and all orders for this visit:  Primary hypertension -     valsartan (DIOVAN) 40 MG tablet; Take 1 tablet (40 mg total) by mouth daily. -     Basic metabolic panel  Atopic dermatitis in adult -     triamcinolone ointment (KENALOG) 0.5 %; Apply 1 Application topically 2 (two) times daily. To back    Patient has been counseled on age-appropriate routine health concerns for screening and prevention. These are reviewed and up-to-date. Referrals have been placed accordingly. Immunizations are up-to-date or declined.    Subjective:   Chief Complaint  Patient presents with   Hypertension   Hypertension Pertinent negatives include no blurred vision, chest pain, headaches, malaise/fatigue, palpitations or shortness of breath.   [name] 51 y.o. female presents to office today for follow up to HTN  She has a past medical history of Asthma (1990), Hypertension (2005), and Seizures    HTN Blood pressure is not well controlled. Will dc zestoretic 20-12.5 mg daily and start on valsartan 40 mg daily. She does not monitor her blood pressure at home and endorses medication adherence.  BP Readings from Last 3 Encounters:  11/24/21 (!) 136/93  07/21/21 (!) 132/92  10/07/20 123/90    Review of Systems  Constitutional:  Negative for fever, malaise/fatigue and weight loss.  HENT: Negative.  Negative for nosebleeds.   Eyes: Negative.  Negative for blurred vision, double vision and photophobia.  Respiratory: Negative.  Negative for cough and shortness of breath.   Cardiovascular: Negative.  Negative for chest pain, palpitations and leg swelling.  Gastrointestinal: Negative.  Negative for heartburn, nausea and vomiting.  Musculoskeletal: Negative.  Negative for myalgias.  Neurological: Negative.  Negative for dizziness, focal weakness, seizures and headaches.  Psychiatric/Behavioral: Negative.  Negative for  suicidal ideas.     Past Medical History:  Diagnosis Date   Asthma 1990   uses inhalers PRN   Hypertension 2005   on meds   Seizures ([place])    childhood-"my mother said I just grew out of them and have not had any more since I was a yound girl"    Past Surgical History:  Procedure Laterality Date   CHOLECYSTECTOMY     EYE SURGERY Left    TUBAL LIGATION      Family History  Problem Relation Age of Onset   Hypertension Mother    Heart disease Father    Diabetes Maternal Grandfather    Colon polyps Neg Hx    Colon cancer Neg Hx    Esophageal cancer Neg Hx    Rectal cancer Neg Hx    Stomach cancer Neg Hx     Social History Reviewed with no changes to be made today.   Outpatient Medications Prior to Visit  Medication Sig Dispense Refill   albuterol (PROAIR HFA) 108 (90 Base) MCG/ACT inhaler Inhale 2 puffs into the lungs every 6 (six) hours as needed for wheezing or shortness of breath. 8.5 g 2   Blood Pressure Monitor DEVI Please provide patient with insurance approved blood pressure monitor 1 Device 0   PROAIR HFA 108 (90 Base) MCG/ACT inhaler TAKE 2 PUFFS BY MOUTH EVERY 6 HOURS AS NEEDED FOR WHEEZE OR SHORTNESS OF BREATH **INS REQ PROAIR 8.5 each 0   lisinopril-hydrochlorothiazide (ZESTORETIC) 20-12.5 MG tablet Take 1 tablet by mouth daily. 90 tablet 3   No facility-administered medications prior to visit.    No Known  Allergies     Objective:    BP (!) 136/93   Pulse 80   Temp 97.9 F (36.6 C) (Oral)   Ht 5' (1.524 m)   Wt 165 lb (74.8 kg)   SpO2 97%   BMI 32.22 kg/m  Wt Readings from Last 3 Encounters:  11/24/21 165 lb (74.8 kg)  11/12/21 164 lb (74.4 kg)  07/21/21 159 lb 12.8 oz (72.5 kg)    Physical Exam Vitals and nursing note reviewed.  Constitutional:      Appearance: She is well-developed.  HENT:     Head: Normocephalic and atraumatic.   Cardiovascular:     Rate and Rhythm: Normal rate and regular rhythm.     Heart sounds: Normal heart  sounds. No murmur heard.    No friction rub. No gallop.  Pulmonary:     Effort: Pulmonary effort is normal. No tachypnea or respiratory distress.     Breath sounds: Normal breath sounds. No decreased breath sounds, wheezing, rhonchi or rales.    Chest:     Chest wall: No tenderness.  Abdominal:     General: Bowel sounds are normal.     Palpations: Abdomen is soft.  Musculoskeletal:        General: Normal range of motion.     Cervical back: Normal range of motion.  Skin:    General: Skin is warm and dry.  Neurological:     Mental Status: She is alert and oriented to person, place, and time.     Coordination: Coordination normal.  Psychiatric:        Behavior: Behavior normal. Behavior is cooperative.        Thought Content: Thought content normal.        Judgment: Judgment normal.          Patient has been counseled extensively about nutrition and exercise as well as the importance of adherence with medications and regular follow-up. The patient was given clear instructions to go to ER or return to medical center if symptoms don't improve, worsen or new problems develop. The patient verbalized understanding.   Follow-up: Return for 4 weeks BP check with Lurena Joiner. See me in 3 months.   [name], FNP-BC [facility] [place], [place]   11/24/2021, 4:38 PM

## 2021-11-25 LAB — BASIC METABOLIC PANEL
BUN/Creatinine Ratio: 21 (ref 9–23)
BUN: 15 mg/dL (ref 6–24)
CO2: 22 mmol/L (ref 20–29)
Calcium: 9.6 mg/dL (ref 8.7–10.2)
Chloride: 101 mmol/L (ref 96–106)
Creatinine, Ser: 0.73 mg/dL (ref 0.57–1.00)
Glucose: 89 mg/dL (ref 70–99)
Potassium: 4 mmol/L (ref 3.5–5.2)
Sodium: 140 mmol/L (ref 134–144)
eGFR: 101 mL/min/{1.73_m2} (ref >59)

## 2021-11-26 ENCOUNTER — Other Ambulatory Visit: Payer: Self-pay

## 2021-11-26 ENCOUNTER — Other Ambulatory Visit: Payer: Self-pay | Admitting: Pharmacist

## 2021-11-26 ENCOUNTER — Ambulatory Visit: Payer: Self-pay | Admitting: *Deleted

## 2021-11-26 DIAGNOSIS — I10 Essential (primary) hypertension: Secondary | ICD-10-CM

## 2021-11-26 MED ORDER — VALSARTAN 40 MG PO TABS
40.0000 mg | ORAL_TABLET | Freq: Every day | ORAL | 3 refills | Status: DC
  Filled 2021-11-26 (×2): qty 30, 30d supply, fill #0
  Filled 2021-12-20: qty 30, 30d supply, fill #1
  Filled 2022-01-23: qty 30, 30d supply, fill #2
  Filled 2022-02-17: qty 30, 30d supply, fill #3

## 2021-11-26 NOTE — Telephone Encounter (Addendum)
Pt called back and Valsartan is back ordered- [facility] does not have in stock- pt is taking her previous BP medication.  Routing to office. Reason for Disposition  [1] Caller has URGENT medicine question about med that PCP or specialist prescribed AND [2] triager unable to answer question  Answer Assessment - Initial Assessment Questions 1. NAME of MEDICINE: "What medicine(s) are you calling about?"     Valsartan 2. QUESTION: "What is your question?" (e.g., double dose of medicine, side effect)     [facility] 3. PRESCRIBER: "Who prescribed the medicine?" Reason: if prescribed by specialist, call should be referred to that group.     PCP 4. SYMPTOMS: "Do you have any symptoms?" If Yes, ask: "What symptoms are you having?"  "How bad are the symptoms (e.g., mild, moderate, severe)     N/a 5. PREGNANCY:  "Is there any chance that you are pregnant?" "When was your last menstrual period?"     N/a  Protocols used: Medication Question Call-A-AH

## 2021-11-26 NOTE — Telephone Encounter (Signed)
Summary: Back ordered Lisinopril, What to do?   Patient called in stating her prescription for blood pressure medication has been back ordered at her pharmacy. She states her provider has upped her dosage on her Lisinopril which was HCT 20 mg/ 12.5 mg She was taking Valsartan previously but the provider changed her to Lisinopril and has since upped her dosage on that because the 20/12.5 mg weren't really helping. Please assist patient further. The [facility]   [id number] [address]  Phone: [phone] Fax: [phone]     Called patient to review medication issue being on back order. No answer. LVMTCB. Please advise what patent is to do since lisinopril is on back order?

## 2021-12-03 ENCOUNTER — Ambulatory Visit (AMBULATORY_SURGERY_CENTER): Payer: Managed Care, Other (non HMO) | Admitting: Gastroenterology

## 2021-12-03 ENCOUNTER — Encounter: Payer: Self-pay | Admitting: Gastroenterology

## 2021-12-03 VITALS — BP 112/90 | HR 57 | Temp 98.0°F | Resp 14 | Ht 60.0 in | Wt 164.0 lb

## 2021-12-03 DIAGNOSIS — Z1211 Encounter for screening for malignant neoplasm of colon: Secondary | ICD-10-CM | POA: Diagnosis present

## 2021-12-03 DIAGNOSIS — D125 Benign neoplasm of sigmoid colon: Secondary | ICD-10-CM

## 2021-12-03 DIAGNOSIS — D122 Benign neoplasm of ascending colon: Secondary | ICD-10-CM

## 2021-12-03 MED ORDER — SODIUM CHLORIDE 0.9 % IV SOLN: 500.0000 mL | Freq: Once | INTRAVENOUS | Status: DC

## 2021-12-03 NOTE — Progress Notes (Signed)
   Referring Provider: [name], NP Primary Care Physician:  [name], NP   Indication for Colonoscopy:  Colon cancer screening   IMPRESSION:  Need for colon cancer screening Appropriate candidate for monitored anesthesia care  PLAN: Colonoscopy in the [facility] [place] today   HPI: [name] is a 51 y.o. female presents for screening colonoscopy.  No prior colonoscopy or colon cancer screening.  No known family history of colon cancer or polyps. No family history of uterine/endometrial cancer, pancreatic cancer or gastric/stomach cancer.   Past Medical History:  Diagnosis Date   Asthma 1990   uses inhalers PRN   Hypertension 2005   on meds   Seizures ([place])    childhood-"my mother said I just grew out of them and have not had any more since I was a yound girl"    Past Surgical History:  Procedure Laterality Date   CHOLECYSTECTOMY     EYE SURGERY Left    TUBAL LIGATION      Current Outpatient Medications  Medication Sig Dispense Refill   Blood Pressure Monitor DEVI Please provide patient with insurance approved blood pressure monitor 1 Device 0   valsartan (DIOVAN) 40 MG tablet Take 1 tablet (40 mg total) by mouth daily. 30 tablet 3   albuterol (PROAIR HFA) 108 (90 Base) MCG/ACT inhaler Inhale 2 puffs into the lungs every 6 (six) hours as needed for wheezing or shortness of breath. 8.5 g 2   PROAIR HFA 108 (90 Base) MCG/ACT inhaler TAKE 2 PUFFS BY MOUTH EVERY 6 HOURS AS NEEDED FOR WHEEZE OR SHORTNESS OF BREATH **INS REQ PROAIR 8.5 each 0   triamcinolone ointment (KENALOG) 0.5 % Apply 1 Application topically 2 (two) times daily. To back 60 g 3   Current Facility-Administered Medications  Medication Dose Route Frequency Provider Last Rate Last Admin   0.9 %  sodium chloride infusion  500 mL Intravenous Once Thornton Park, MD        Allergies as of 12/03/2021   (No Known Allergies)    Family History  Problem Relation Age of Onset   Hypertension  Mother    Heart disease Father    Diabetes Maternal Grandfather    Colon polyps Neg Hx    Colon cancer Neg Hx    Esophageal cancer Neg Hx    Rectal cancer Neg Hx    Stomach cancer Neg Hx      Physical Exam: General:   Alert,  well-nourished, pleasant and cooperative in NAD Head:  Normocephalic and atraumatic. Eyes:  Sclera clear, no icterus.   Conjunctiva pink. Mouth:  No deformity or lesions.   Neck:  Supple; no masses or thyromegaly. Lungs:  Clear throughout to auscultation.   No wheezes. Heart:  Regular rate and rhythm; no murmurs. Abdomen:  Soft, non-tender, nondistended, normal bowel sounds, no rebound or guarding.  Msk:  Symmetrical. No boney deformities LAD: No inguinal or umbilical LAD Extremities:  No clubbing or edema. Neurologic:  Alert and  oriented x4;  grossly nonfocal Skin:  No obvious rash or bruise. Psych:  Alert and cooperative. Normal mood and affect.     Studies/Results: No results found.    Johnross Nabozny L. Tarri Glenn, MD, MPH 12/03/2021, 8:05 AM

## 2021-12-03 NOTE — Op Note (Signed)
[facility] Patient Name: [name] Procedure Date: 12/03/2021 7:23 AM MRN: [id number] Endoscopist: Thornton Park MD, MD Age: 51 Referring MD:  Date of Birth: 1970-05-27 Gender: Female Account #: [id number] Procedure:                Colonoscopy Indications:              Screening for colorectal malignant neoplasm, This                            is the patient's first colonoscopy                           No known family history of colon cancer or polyps Medicines:                Monitored Anesthesia Care Procedure:                Pre-Anesthesia Assessment:                           - Prior to the procedure, a History and Physical                            was performed, and patient medications and                            allergies were reviewed. The patient's tolerance of                            previous anesthesia was also reviewed. The risks                            and benefits of the procedure and the sedation                            options and risks were discussed with the patient.                            All questions were answered, and informed consent                            was obtained. Prior Anticoagulants: The patient has                            taken no previous anticoagulant or antiplatelet                            agents. ASA Grade Assessment: II - A patient with                            mild systemic disease. After reviewing the risks                            and benefits, the patient was deemed in  satisfactory condition to undergo the procedure.                           After obtaining informed consent, the colonoscope                            was passed under direct vision. Throughout the                            procedure, the patient's blood pressure, pulse, and                            oxygen saturations were monitored continuously. The                            CF [id number] #[id number]  was introduced through the anus                            and advanced to the 3 cm into the ileum. A second                            forward view of the right colon was performed. The                            colonoscopy was performed with moderate difficulty                            due to a tortuous colon. The patient tolerated the                            procedure well. The quality of the bowel                            preparation was good. The terminal ileum, ileocecal                            valve, appendiceal orifice, and rectum were                            photographed. Scope In: 8:12:04 AM Scope Out: 8:36:31 AM Scope Withdrawal Time: 0 hours 21 minutes 1 second  Total Procedure Duration: 0 hours 24 minutes 27 seconds  Findings:                 The perianal and digital rectal examinations were                            normal.                           Non-bleeding internal hemorrhoids were found.                           Multiple small and large-mouthed diverticula were  found in the entire colon.                           A 20 mm polyp was found in the sigmoid colon. It                            was located 34 cm from the anal verge. The polyp                            was pedunculated. The polyp was removed with a                            saline injection-lift technique using a hot snare.                            I injected the base of the polyp with 3cc of                            1:10,000 epinephrine and 2 mL of Spot. Resection                            and retrieval were complete. Area was tattooed with                            an additional injection of 2 mL of Spot (carbon                            black).                           A 2 mm polyp was found in the ascending colon. The                            polyp was sessile. The polyp was removed with a                            cold snare. Resection and  retrieval were complete.                            Estimated blood loss was minimal.                           The exam was otherwise without abnormality on                            direct and retroflexion views. Complications:            No immediate complications. Estimated Blood Loss:     Estimated blood loss was minimal. Impression:               - Non-bleeding internal hemorrhoids.                           - Diverticulosis in the entire  examined colon.                           - One 20 mm polyp in the sigmoid colon, removed                            using injection-lift and a hot snare. Resected and                            retrieved. Tattooed.                           - One 2 mm polyp in the ascending colon, removed                            with a cold snare. Resected and retrieved.                           - The examination was otherwise normal on direct                            and retroflexion views. Recommendation:           - Patient has a contact number available for                            emergencies. The signs and symptoms of potential                            delayed complications were discussed with the                            patient. Return to normal activities tomorrow.                            Written discharge instructions were provided to the                            patient.                           - Resume previous diet.                           - Continue present medications.                           - Await pathology results.                           - Repeat colonoscopy date to be determined after                            pending pathology results are reviewed for  surveillance.                           - Follow a high fiber diet. Drink at least 64                            ounces of water daily. Add a daily stool bulking                            agent such as psyllium (an exampled would be                             Metamucil). Thornton Park MD, MD 12/03/2021 8:43:38 AM This report has been signed electronically.

## 2021-12-03 NOTE — Progress Notes (Signed)
Pt's states no medical or surgical changes since previsit or office visit. 

## 2021-12-03 NOTE — Progress Notes (Signed)
Called to room to assist during endoscopic procedure.  Patient ID and intended procedure confirmed with present staff. Received instructions for my participation in the procedure from the performing physician.  

## 2021-12-03 NOTE — Patient Instructions (Signed)
Thank you for coming in to see [place] today! Resume your regular diet and medications today. Return to your regular daily activities. Biopsy results will be available in 1-2 weeks and recommendation will be made at that time for a future colonoscopy.   YOU HAD AN ENDOSCOPIC PROCEDURE TODAY AT [facility]:   Refer to the procedure report that was given to you for any specific questions about what was found during the examination.  If the procedure report does not answer your questions, please call your gastroenterologist to clarify.  If you requested that your care partner not be given the details of your procedure findings, then the procedure report has been included in a sealed envelope for you to review at your convenience later.  YOU SHOULD EXPECT: Some feelings of bloating in the abdomen. Passage of more gas than usual.  Walking can help get rid of the air that was put into your GI tract during the procedure and reduce the bloating. If you had a lower endoscopy (such as a colonoscopy or flexible sigmoidoscopy) you may notice spotting of blood in your stool or on the toilet paper. If you underwent a bowel prep for your procedure, you may not have a normal bowel movement for a few days.  Please Note:  You might notice some irritation and congestion in your nose or some drainage.  This is from the oxygen used during your procedure.  There is no need for concern and it should clear up in a day or so.  SYMPTOMS TO REPORT IMMEDIATELY:  Following lower endoscopy (colonoscopy or flexible sigmoidoscopy):  Excessive amounts of blood in the stool  Significant tenderness or worsening of abdominal pains  Swelling of the abdomen that is new, acute  Fever of 100F or higher    For urgent or emergent issues, a gastroenterologist can be reached at any hour by calling [phone]. Do not use MyChart messaging for urgent concerns.    DIET:  We do recommend a small meal at first, but then  you may proceed to your regular diet.  Drink plenty of fluids but you should avoid alcoholic beverages for 24 hours.  ACTIVITY:  You should plan to take it easy for the rest of today and you should NOT DRIVE or use heavy machinery until tomorrow (because of the sedation medicines used during the test).    FOLLOW UP: Our staff will call the number listed on your records the next business day following your procedure.  We will call around 7:15- 8:00 am to check on you and address any questions or concerns that you may have regarding the information given to you following your procedure. If we do not reach you, we will leave a message.  If you develop any symptoms (ie: fever, flu-like symptoms, shortness of breath, cough etc.) before then, please call [phone].  If you test positive for Covid 19 in the 2 weeks post procedure, please call and report this information to [place].    If any biopsies were taken you will be contacted by phone or by letter within the next 1-3 weeks.  Please call us at [phone] if you have not heard about the biopsies in 3 weeks.    SIGNATURES/CONFIDENTIALITY: You and/or your care partner have signed paperwork which will be entered into your electronic medical record.  These signatures attest to the fact that that the information above on your After Visit Summary has been reviewed and is understood.  Full responsibility of  the confidentiality of this discharge information lies with you and/or your care-partner.

## 2021-12-03 NOTE — Progress Notes (Signed)
PT taken to PACU. Monitors in place. VSS. Report given to RN. 

## 2021-12-07 ENCOUNTER — Telehealth: Payer: Self-pay | Admitting: *Deleted

## 2021-12-07 NOTE — Telephone Encounter (Signed)
  Follow up Call-     12/03/2021    7:09 AM  Call back number  Post procedure Call Back phone  # [phone]  Permission to leave phone message Yes     Patient questions:  Do you have a fever, pain , or abdominal swelling? No. Pain Score  0 *  Have you tolerated food without any problems? Yes.    Have you been able to return to your normal activities? Yes.    Do you have any questions about your discharge instructions: Diet   No. Medications  No. Follow up visit  No.  Do you have questions or concerns about your Care? No.  Actions: * If pain score is 4 or above: No action needed, pain <4.

## 2021-12-10 ENCOUNTER — Ambulatory Visit
Admission: RE | Admit: 2021-12-10 | Discharge: 2021-12-10 | Disposition: A | Discharge status: Home / Self Care | Payer: Managed Care, Other (non HMO) | Source: Ambulatory Visit | Attending: Nurse Practitioner | Admitting: Nurse Practitioner

## 2021-12-10 DIAGNOSIS — Z1231 Encounter for screening mammogram for malignant neoplasm of breast: Secondary | ICD-10-CM

## 2021-12-20 ENCOUNTER — Other Ambulatory Visit: Payer: Self-pay

## 2021-12-28 NOTE — Progress Notes (Signed)
   S:     PCP: Evorn Gong Jennelle Pinkstaff is a 51 y.o. female who presents for hypertension evaluation, education, and management. PMH is significant for HTN.   Patient was referred and last seen by Primary Care Provider, NP. [name], on 8/23. At last visit, BP was elevated with a reading of 136/93. Zestoretic 20-12.'5mg'$  once daily was discontinued and valsartan '40mg'$  once daily was initiated.   Today, patient arrives in good spirits and presents without assistance. Denies dizziness, headache, blurred vision, swelling.   Family/Social history:  -Fhx: DM, HTN, CVD -Tobacco: denies -Alcohol: denies  Medication adherence appropriate. Patient has taken BP medications today.   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    O:   Vitals:   12/30/21 0913  BP: 127/89  Pulse: 80    Last 3 Office BP readings: BP Readings from Last 3 Encounters:  12/30/21 127/89  12/03/21 (!) 112/90  11/24/21 (!) 136/93    BMET    Component Value Date/Time   NA 140 11/24/2021 1627   K 4.0 11/24/2021 1627   CL 101 11/24/2021 1627   CO2 22 11/24/2021 1627   GLUCOSE 89 11/24/2021 1627   GLUCOSE 107 (H) 06/07/2016 0907   BUN 15 11/24/2021 1627   CREATININE 0.73 11/24/2021 1627   CREATININE 0.75 06/07/2016 0907   CALCIUM 9.6 11/24/2021 1627   GFRNONAA 100 11/13/2019 1449   GFRNONAA 65 11/23/2015 1252   GFRAA 115 11/13/2019 1449   GFRAA 75 11/23/2015 1252    Renal function: CrCl cannot be calculated (Patient's most recent lab result is older than the maximum 21 days  allowed.).  Clinical ASCVD: No  The 10-year ASCVD risk score (Arnett DK, et al., 2019) is: 3.2%   Values used to calculate the score:     Age: 77 years     Sex: Female     Is Non-Hispanic African American: Yes     Diabetic: No     Tobacco smoker: No     Systolic Blood Pressure: 616 mmHg     Is BP treated: Yes     HDL Cholesterol: 55 mg/dL     Total Cholesterol: 209 mg/dL   A/P: Hypertension longstanding currently close to goal on current medications. BP goal < 130/80 mmHg. Medication adherence appears appropriate.  -Continued valsartan '40mg'$  once daily -Initiated amlodipine '5mg'$  once daily.  -Patient educated on purpose, proper use, and potential adverse effects of amlodipine and valsartan.  -Counseled on lifestyle modifications for blood pressure control including reduced dietary sodium, increased exercise, adequate sleep. -Encouraged patient to check BP at home and bring log of readings to next visit. Counseled on proper use of home BP cuff.    Results reviewed and written information provided.    Written patient instructions provided. Patient verbalized understanding of treatment plan.  Total time in face to face counseling 30 minutes.    Follow-up:  Pharmacist in one month. PCP clinic visit in December  Maryan Puls, PharmD PGY-1 [facility] Resident

## 2021-12-30 ENCOUNTER — Ambulatory Visit: Payer: Managed Care, Other (non HMO) | Attending: Nurse Practitioner | Admitting: Pharmacist

## 2021-12-30 VITALS — BP 127/89 | HR 80

## 2021-12-30 DIAGNOSIS — I10 Essential (primary) hypertension: Secondary | ICD-10-CM

## 2021-12-30 MED ORDER — AMLODIPINE BESYLATE 5 MG PO TABS: 5.0000 mg | ORAL_TABLET | Freq: Every day | ORAL | 1 refills | Status: DC

## 2022-01-20 ENCOUNTER — Encounter: Payer: Self-pay | Admitting: Gastroenterology

## 2022-01-24 ENCOUNTER — Other Ambulatory Visit: Payer: Self-pay

## 2022-02-01 ENCOUNTER — Ambulatory Visit: Payer: Managed Care, Other (non HMO) | Attending: Nurse Practitioner | Admitting: Pharmacist

## 2022-02-01 ENCOUNTER — Encounter: Payer: Self-pay | Admitting: Pharmacist

## 2022-02-01 VITALS — BP 122/85 | HR 80

## 2022-02-01 DIAGNOSIS — I10 Essential (primary) hypertension: Secondary | ICD-10-CM

## 2022-02-01 NOTE — Progress Notes (Signed)
   S:     PCP: Evorn Gong Molleigh Huot is a 51 y.o. female who presents for hypertension evaluation, education, and management. PMH is significant for HTN.   Patient was referred and last seen by Primary Care Provider, NP [name], on 8/23. We saw her on 12/30/2021 and added amlodipine to her regimen. Today, patient arrives in good spirits and presents without assistance. Denies dizziness, headache, blurred vision, swelling.   Family/Social history:  -Fhx: DM, HTN, CVD -Tobacco: denies -Alcohol: denies  Medication adherence appropriate. Patient has taken BP medications today.   Current antihypertensives include: amlodipine 5 mg daily, valsartan '40mg'$  once daily   Antihypertensives tried in the past include: zestoretic   Reported home BP readings: checks at home but forget her log today.   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   O:   Vitals:   02/01/22 0854  BP: 122/85  Pulse: 80    Last 3 Office BP readings: BP Readings from Last 3 Encounters:  02/01/22 122/85  12/30/21 127/89  12/03/21 (!) 112/90    BMET    Component Value Date/Time   NA 140 11/24/2021 1627   K 4.0 11/24/2021 1627   CL 101 11/24/2021 1627   CO2 22 11/24/2021 1627   GLUCOSE 89 11/24/2021 1627   GLUCOSE 107 (H) 06/07/2016 0907   BUN 15 11/24/2021 1627   CREATININE 0.73 11/24/2021 1627   CREATININE 0.75 06/07/2016 0907   CALCIUM 9.6 11/24/2021 1627   GFRNONAA 100 11/13/2019 1449   GFRNONAA 65 11/23/2015 1252   GFRAA 115 11/13/2019 1449   GFRAA 75 11/23/2015 1252    Renal function: CrCl cannot be calculated (Patient's most recent lab result is older than the maximum 21 days allowed.).  Clinical ASCVD: No  The 10-year ASCVD risk score  (Arnett DK, et al., 2019) is: 2.7%   Values used to calculate the score:     Age: 51 years     Sex: Female     Is Non-Hispanic African American: Yes     Diabetic: No     Tobacco smoker: No     Systolic Blood Pressure: 852 mmHg     Is BP treated: Yes     HDL Cholesterol: 55 mg/dL     Total Cholesterol: 209 mg/dL   A/P: Hypertension longstanding currently close to goal on current medications. BP goal < 130/80 mmHg. Medication adherence appears appropriate.  -Continued valsartan '40mg'$  once daily -Continue amlodipine '5mg'$  once daily.  -Patient educated on purpose, proper use, and potential adverse effects of amlodipine and valsartan.  -Counseled on lifestyle modifications for blood pressure control including reduced dietary sodium, increased exercise, adequate sleep. -Encouraged patient to check BP at home and bring log of readings to next visit. Counseled on proper use of home BP cuff.    Results reviewed and written information provided.    Written patient instructions provided. Patient verbalized understanding of treatment plan.  Total time in face to face counseling 30 minutes.    Follow-up:  PCP clinic visit in December  Benard Halsted, PharmD, [place], [place] [phone]

## 2022-02-16 IMAGING — MG MM DIGITAL SCREENING BILAT W/ TOMO AND CAD
8 series · 8 of 24 positions shown · non-contrast
Comparison: Previous exam(s).

CLINICAL DATA: Screening.

EXAM:
DIGITAL SCREENING BILATERAL MAMMOGRAM WITH TOMOSYNTHESIS AND CAD
TECHNIQUE: Bilateral screening digital craniocaudal and mediolateral oblique
mammograms were obtained. Bilateral screening digital breast
tomosynthesis was performed. The images were evaluated with
computer-aided detection.

[R CC synth-2D]
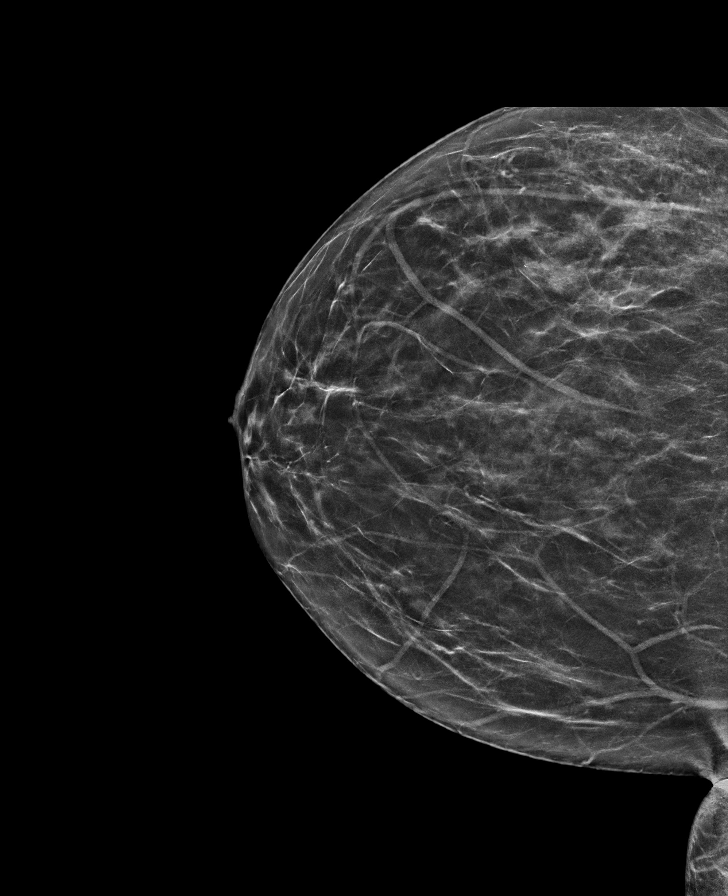

[L MLO synth-2D]
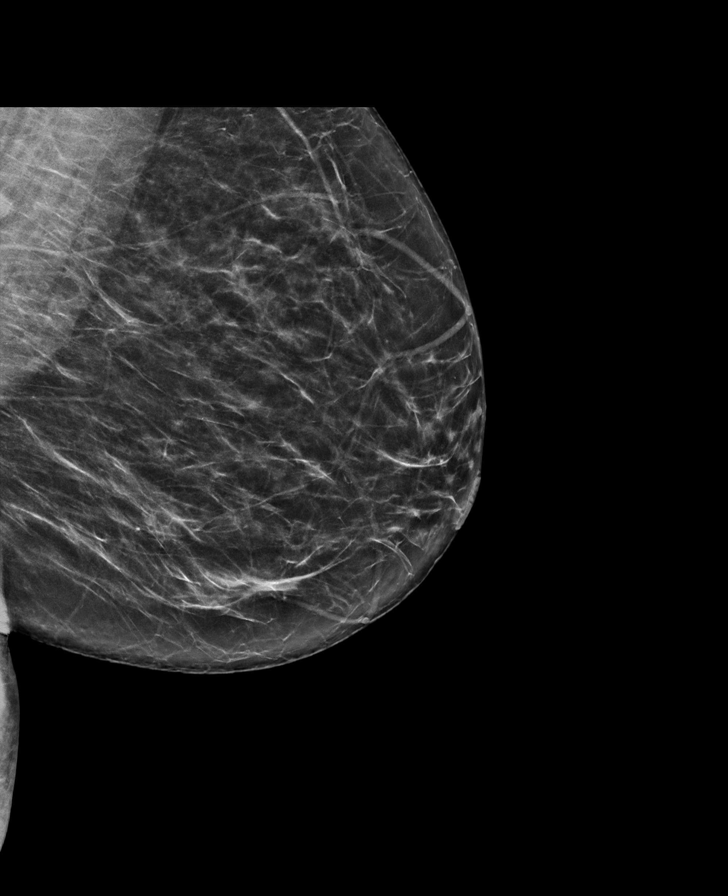

[L CC synth-2D]
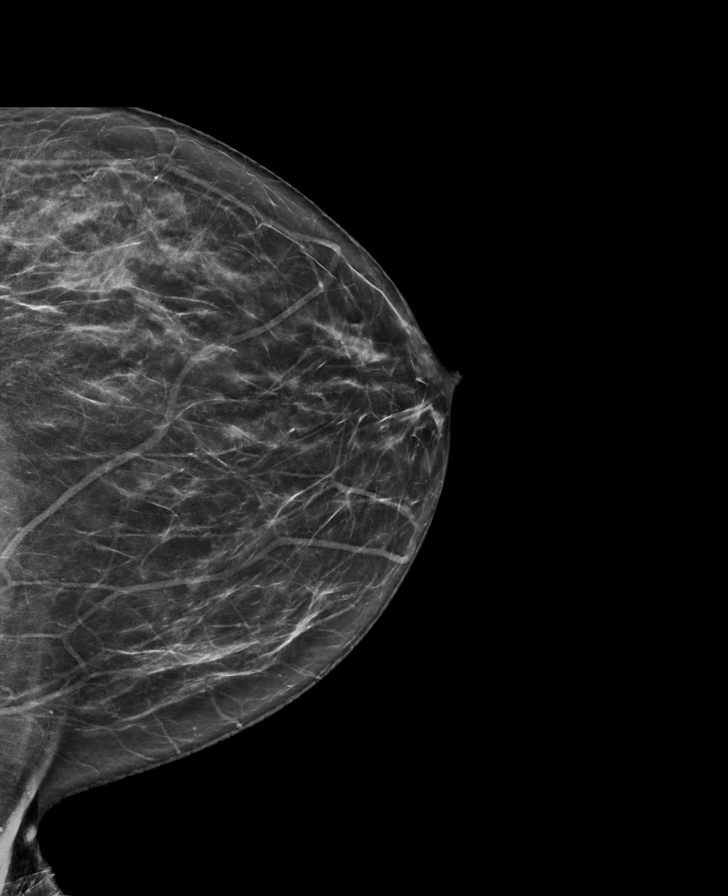

[R MLO synth-2D]
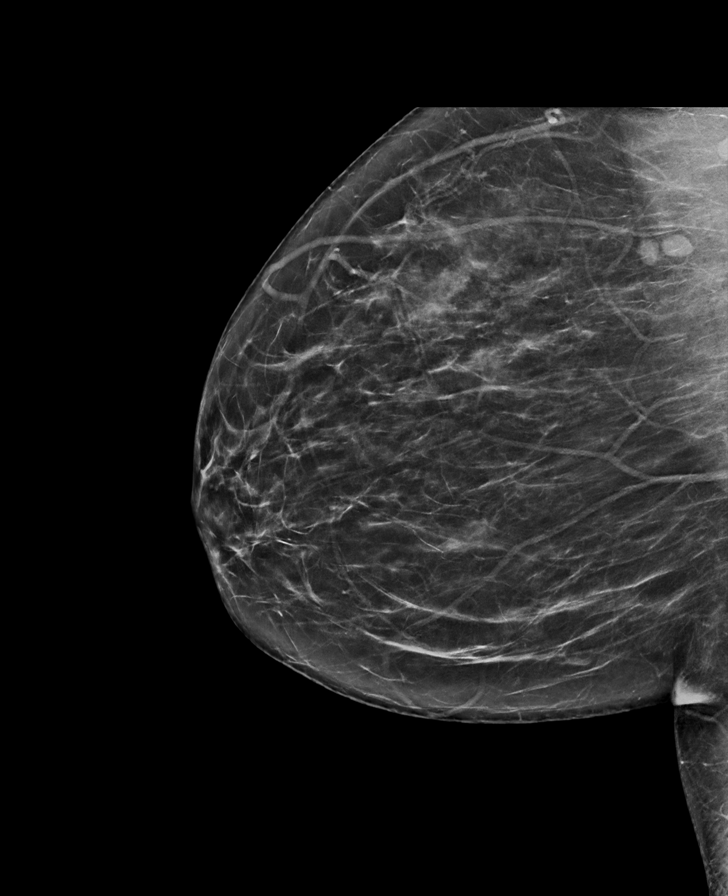

[L CC tomo · tomo slice 36/71.0]
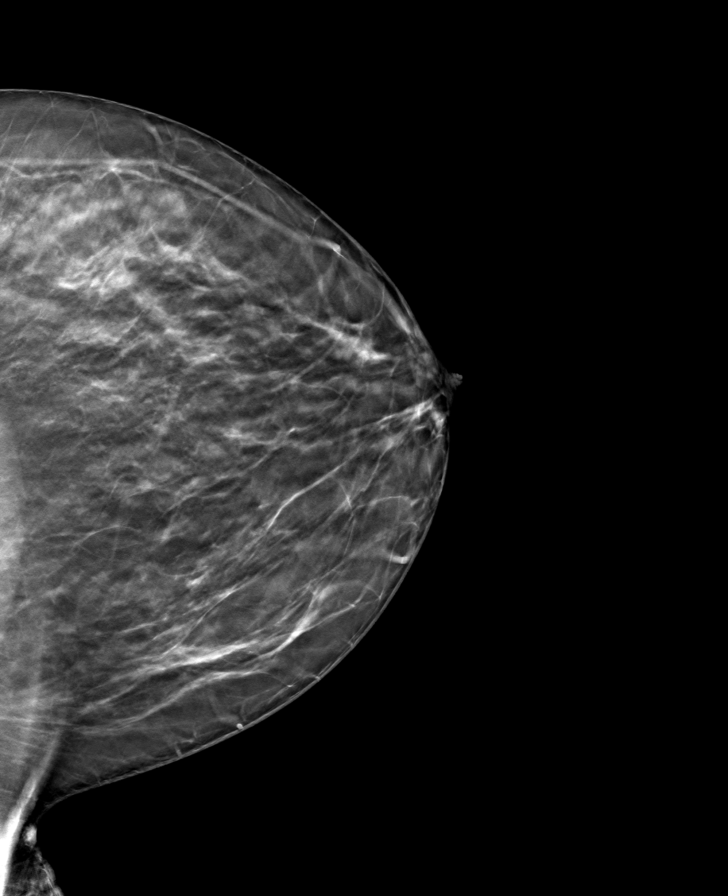

[R MLO tomo · tomo slice 38/75.0]
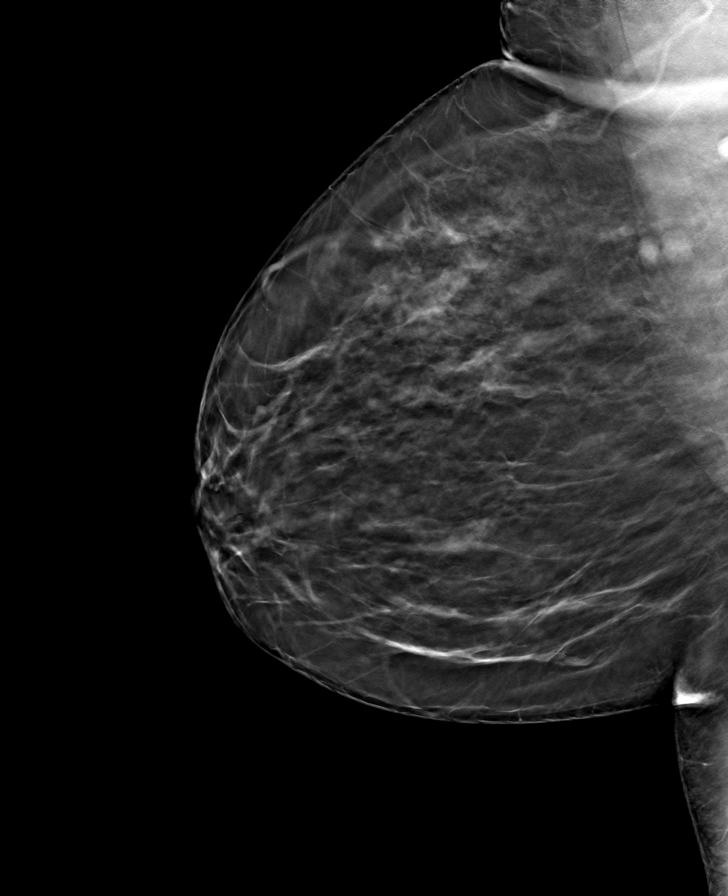

[R CC tomo · tomo slice 33/65.0]
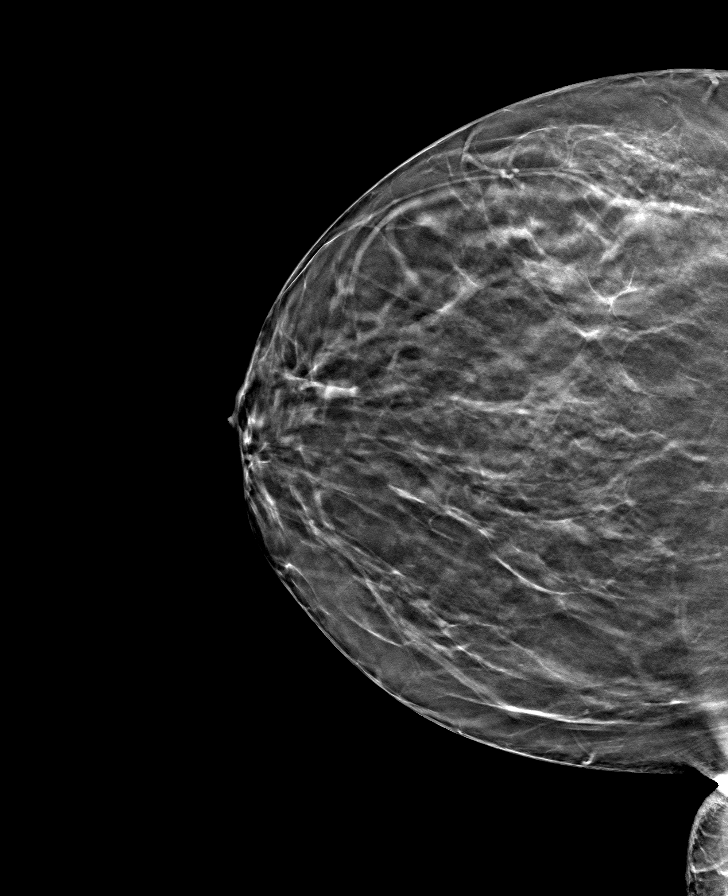

[L MLO tomo · tomo slice 37/73.0]
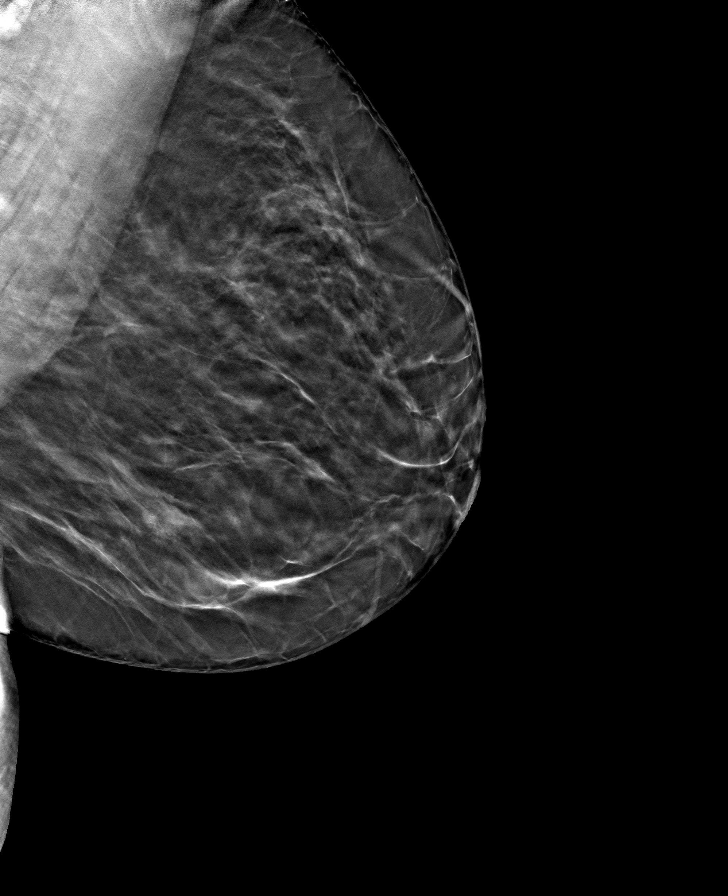

[8 of 24 positions shown; findings below may reference images not displayed]

ACR Breast Density Category b: There are scattered areas of
fibroglandular density.
FINDINGS: There are no findings suspicious for malignancy.
IMPRESSION: No mammographic evidence of malignancy. A result letter of this
screening mammogram will be mailed directly to the patient.

RECOMMENDATION:
Screening mammogram in one year. (Code:51-O-LD2)

BI-RADS CATEGORY  1: Negative.

## 2022-02-18 ENCOUNTER — Other Ambulatory Visit: Payer: Self-pay

## 2022-03-04 ENCOUNTER — Ambulatory Visit: Payer: Managed Care, Other (non HMO) | Attending: Nurse Practitioner | Admitting: Nurse Practitioner

## 2022-03-04 ENCOUNTER — Encounter: Payer: Self-pay | Admitting: Nurse Practitioner

## 2022-03-04 ENCOUNTER — Other Ambulatory Visit: Payer: Self-pay

## 2022-03-04 VITALS — BP 149/91 | HR 78 | Temp 97.8°F | Ht 60.0 in | Wt 157.2 lb

## 2022-03-04 DIAGNOSIS — I10 Essential (primary) hypertension: Secondary | ICD-10-CM | POA: Diagnosis not present

## 2022-03-04 DIAGNOSIS — R7303 Prediabetes: Secondary | ICD-10-CM | POA: Diagnosis not present

## 2022-03-04 LAB — POCT GLYCOSYLATED HEMOGLOBIN (HGB A1C): HbA1c, POC (controlled diabetic range): 5.6 % (ref 0.0–7.0)

## 2022-03-04 MED ORDER — VALSARTAN 40 MG PO TABS
40.0000 mg | ORAL_TABLET | Freq: Every day | ORAL | 1 refills | Status: DC
  Filled 2022-03-04: qty 90, 90d supply, fill #0
  Filled 2022-03-21: qty 30, 30d supply, fill #0
  Filled 2022-04-19: qty 30, 30d supply, fill #1
  Filled 2022-05-18: qty 30, 30d supply, fill #2

## 2022-03-04 NOTE — Progress Notes (Signed)
Assessment & Plan:  [name] was seen today for hypertension and prediabetes.  Diagnoses and all orders for this visit:  Primary hypertension -     valsartan (DIOVAN) 40 MG tablet; Take 1 tablet (40 mg total) by mouth daily. Continue all antihypertensives as prescribed.  Reminded to bring in blood pressure log for follow  up appointment.  RECOMMENDATIONS: DASH/Mediterranean Diets are healthier choices for HTN.    Prediabetes -     POCT glycosylated hemoglobin (Hb A1C)    Patient has been counseled on age-appropriate routine health concerns for screening and prevention. These are reviewed and up-to-date. Referrals have been placed accordingly. Immunizations are up-to-date or declined.    Subjective:   Chief Complaint  Patient presents with   Hypertension   Prediabetes   HPI [name] 51 y.o. female presents to office today for follow up to HTN and HPL   HTN Blood pressure is slightly elevated. She admits to eating a high sodium dinner last night. Adherent with amlodipine 5 mg daily and valsartan 40 mg daily. She has a monitor at home but does not routinely monitor her blood pressure. States she will start getting her husband to check it for her as she does not know how to use it. BP Readings from Last 3 Encounters:  03/04/22 (!) 149/91  02/01/22 122/85  12/30/21 127/89    Prediabetes Well controlled without any current oral diabetic medications.  Lab Results  Component Value Date   HGBA1C 5.6 03/04/2022     Review of Systems  Constitutional:  Negative for fever, malaise/fatigue and weight loss.  HENT: Negative.  Negative for nosebleeds.   Eyes: Negative.  Negative for blurred vision, double vision and photophobia.  Respiratory: Negative.  Negative for cough and shortness of breath.   Cardiovascular: Negative.  Negative for chest pain, palpitations and leg swelling.  Gastrointestinal: Negative.  Negative for heartburn, nausea and vomiting.   Musculoskeletal: Negative.  Negative for myalgias.  Neurological: Negative.  Negative for dizziness, focal weakness, seizures and headaches.  Psychiatric/Behavioral: Negative.  Negative for suicidal ideas.     Past Medical History:  Diagnosis Date   Asthma 1990   uses inhalers PRN   Hypertension 2005   on meds   Seizures ([place])    childhood-"my mother said I just grew out of them and have not had any more since I was a yound girl"    Past Surgical History:  Procedure Laterality Date   CHOLECYSTECTOMY     EYE SURGERY Left    TUBAL LIGATION      Family History  Problem Relation Age of Onset   Hypertension Mother    Heart disease Father    Diabetes Maternal Grandfather    Colon polyps Neg Hx    Colon cancer Neg Hx    Esophageal cancer Neg Hx    Rectal cancer Neg Hx    Stomach cancer Neg Hx     Social History Reviewed with no changes to be made today.   Outpatient Medications Prior to Visit  Medication Sig Dispense Refill   albuterol (PROAIR HFA) 108 (90 Base) MCG/ACT inhaler Inhale 2 puffs into the lungs every 6 (six) hours as needed for wheezing or shortness of breath. 8.5 g 2   amLODipine (NORVASC) 5 MG tablet Take 1 tablet (5 mg total) by mouth daily. 90 tablet 1   Blood Pressure Monitor DEVI Please provide patient with insurance approved blood pressure monitor 1 Device 0   PROAIR HFA 108 (90  Base) MCG/ACT inhaler TAKE 2 PUFFS BY MOUTH EVERY 6 HOURS AS NEEDED FOR WHEEZE OR SHORTNESS OF BREATH **INS REQ PROAIR 8.5 each 0   triamcinolone ointment (KENALOG) 0.5 % Apply 1 Application topically 2 (two) times daily. To back 60 g 3   valsartan (DIOVAN) 40 MG tablet Take 1 tablet (40 mg total) by mouth daily. 30 tablet 3   No facility-administered medications prior to visit.    No Known Allergies     Objective:    BP (!) 149/91   Pulse 78   Temp 97.8 F (36.6 C) (Temporal)   Ht 5' (1.524 m)   Wt 157 lb 3.2 oz (71.3 kg)   LMP 02/12/2022   SpO2 97%   BMI 30.70  kg/m  Wt Readings from Last 3 Encounters:  03/04/22 157 lb 3.2 oz (71.3 kg)  12/03/21 164 lb (74.4 kg)  11/24/21 165 lb (74.8 kg)    Physical Exam Vitals and nursing note reviewed.  Constitutional:      Appearance: She is well-developed.  HENT:     Head: Normocephalic and atraumatic.  Cardiovascular:     Rate and Rhythm: Normal rate and regular rhythm.     Heart sounds: Normal heart sounds. No murmur heard.    No friction rub. No gallop.  Pulmonary:     Effort: Pulmonary effort is normal. No tachypnea or respiratory distress.     Breath sounds: Normal breath sounds. No decreased breath sounds, wheezing, rhonchi or rales.  Chest:     Chest wall: No tenderness.  Abdominal:     General: Bowel sounds are normal.     Palpations: Abdomen is soft.  Musculoskeletal:        General: Normal range of motion.     Cervical back: Normal range of motion.  Skin:    General: Skin is warm and dry.  Neurological:     Mental Status: She is alert and oriented to person, place, and time.     Coordination: Coordination normal.  Psychiatric:        Behavior: Behavior normal. Behavior is cooperative.        Thought Content: Thought content normal.        Judgment: Judgment normal.          Patient has been counseled extensively about nutrition and exercise as well as the importance of adherence with medications and regular follow-up. The patient was given clear instructions to go to ER or return to medical center if symptoms don't improve, worsen or new problems develop. The patient verbalized understanding.   Follow-up: Return in about 3 months (around 06/03/2022).   [name], FNP-BC [facility] and [place], [place]   03/04/2022, 2:54 PM

## 2022-03-22 ENCOUNTER — Other Ambulatory Visit: Payer: Self-pay

## 2022-04-21 ENCOUNTER — Other Ambulatory Visit: Payer: Self-pay

## 2022-06-06 ENCOUNTER — Encounter: Payer: Self-pay | Admitting: Nurse Practitioner

## 2022-06-06 ENCOUNTER — Ambulatory Visit: Payer: Managed Care, Other (non HMO) | Attending: Nurse Practitioner | Admitting: Nurse Practitioner

## 2022-06-06 VITALS — BP 140/98 | HR 78 | Ht 60.5 in | Wt 151.0 lb

## 2022-06-06 DIAGNOSIS — E7800 Pure hypercholesterolemia, unspecified: Secondary | ICD-10-CM | POA: Diagnosis not present

## 2022-06-06 DIAGNOSIS — I10 Essential (primary) hypertension: Secondary | ICD-10-CM | POA: Diagnosis not present

## 2022-06-06 DIAGNOSIS — R7303 Prediabetes: Secondary | ICD-10-CM

## 2022-06-06 MED ORDER — VALSARTAN 40 MG PO TABS: 40.0000 mg | ORAL_TABLET | Freq: Every day | ORAL | 1 refills | Status: DC

## 2022-06-06 MED ORDER — AMLODIPINE BESYLATE 5 MG PO TABS: 5.0000 mg | ORAL_TABLET | Freq: Every day | ORAL | 1 refills | Status: DC

## 2022-06-06 NOTE — Progress Notes (Signed)
Assessment & Plan:  [name] was seen today for hypertension.  Diagnoses and all orders for this visit:  Primary hypertension Follow-up in 2 to 3 weeks and bring blood pressure monitoring device for review.  I have instructed [name] to check her blood pressure every night for the next 2 weeks and we will review her log at next visit. -     CMP14+EGFR -     amLODipine (NORVASC) 5 MG tablet; Take 1 tablet (5 mg total) by mouth daily. Please fill as a 90 day supply -     valsartan (DIOVAN) 40 MG tablet; Take 1 tablet (40 mg total) by mouth daily. Please fill as a 90 day supply Continue all antihypertensives as prescribed.  Reminded to bring in blood pressure log for follow  up appointment.  RECOMMENDATIONS: DASH/Mediterranean Diets are healthier choices for HTN.    Prediabetes -     Hemoglobin A1c  Hypercholesterolemia -     Lipid panel    Patient has been counseled on age-appropriate routine health concerns for screening and prevention. These are reviewed and up-to-date. Referrals have been placed accordingly. Immunizations are up-to-date or declined.    Subjective:   Chief Complaint  Patient presents with   Hypertension   HPI [name] 52 y.o. female presents to office today for follow up to HTN.   She has a past medical history of asthma, hypertension, prediabetes and hyperlipidemia.  Patient has been counseled on age-appropriate routine health concerns for screening and prevention. These are reviewed and up-to-date. Referrals have been placed accordingly. Immunizations are up-to-date or declined.     Colonoscopy: Up-to-date Mammogram: Up-to-date Pap smear: Up-to-date  HTN Blood pressure is elevated today.  She has a blood pressure monitoring device however she cannot recall any home readings as she does not check her blood pressure often.  She notices medication adherence taking amlodipine 5 mg daily and valsartan 40 mg daily. BP Readings from Last 3  Encounters:  06/06/22 (!) 140/98  03/04/22 (!) 149/91  02/01/22 122/85     Review of Systems  Constitutional:  Negative for fever, malaise/fatigue and weight loss.  HENT: Negative.  Negative for nosebleeds.   Eyes: Negative.  Negative for blurred vision, double vision and photophobia.  Respiratory: Negative.  Negative for cough and shortness of breath.   Cardiovascular: Negative.  Negative for chest pain, palpitations and leg swelling.  Gastrointestinal: Negative.  Negative for heartburn, nausea and vomiting.  Musculoskeletal: Negative.  Negative for myalgias.  Neurological: Negative.  Negative for dizziness, focal weakness, seizures and headaches.  Psychiatric/Behavioral: Negative.  Negative for suicidal ideas.     Past Medical History:  Diagnosis Date   Asthma 1990   uses inhalers PRN   Hypertension 2005   on meds   Seizures ([place])    childhood-"my mother said I just grew out of them and have not had any more since I was a yound girl"    Past Surgical History:  Procedure Laterality Date   CHOLECYSTECTOMY     EYE SURGERY Left    TUBAL LIGATION      Family History  Problem Relation Age of Onset   Hypertension Mother    Heart disease Father    Diabetes Maternal Grandfather    Colon polyps Neg Hx    Colon cancer Neg Hx    Esophageal cancer Neg Hx    Rectal cancer Neg Hx    Stomach cancer Neg Hx     Social History Reviewed with  no changes to be made today.   Outpatient Medications Prior to Visit  Medication Sig Dispense Refill   albuterol (PROAIR HFA) 108 (90 Base) MCG/ACT inhaler Inhale 2 puffs into the lungs every 6 (six) hours as needed for wheezing or shortness of breath. 8.5 g 2   Blood Pressure Monitor DEVI Please provide patient with insurance approved blood pressure monitor 1 Device 0   PROAIR HFA 108 (90 Base) MCG/ACT inhaler TAKE 2 PUFFS BY MOUTH EVERY 6 HOURS AS NEEDED FOR WHEEZE OR SHORTNESS OF BREATH **INS REQ PROAIR 8.5 each 0   triamcinolone ointment  (KENALOG) 0.5 % Apply 1 Application topically 2 (two) times daily. To back 60 g 3   amLODipine (NORVASC) 5 MG tablet Take 1 tablet (5 mg total) by mouth daily. 90 tablet 1   valsartan (DIOVAN) 40 MG tablet Take 1 tablet (40 mg total) by mouth daily. 90 tablet 1   No facility-administered medications prior to visit.    No Known Allergies     Objective:    BP (!) 140/98 (BP Location: Left Arm)   Pulse 78   Ht 5' 0.5" (1.537 m)   Wt 151 lb (68.5 kg)   SpO2 97%   BMI 29.00 kg/m  Wt Readings from Last 3 Encounters:  06/06/22 151 lb (68.5 kg)  03/04/22 157 lb 3.2 oz (71.3 kg)  12/03/21 164 lb (74.4 kg)    Physical Exam Vitals and nursing note reviewed.  Constitutional:      Appearance: She is well-developed.  HENT:     Head: Normocephalic and atraumatic.  Cardiovascular:     Rate and Rhythm: Normal rate and regular rhythm.     Heart sounds: Normal heart sounds. No murmur heard.    No friction rub. No gallop.  Pulmonary:     Effort: Pulmonary effort is normal. No tachypnea or respiratory distress.     Breath sounds: Normal breath sounds. No decreased breath sounds, wheezing, rhonchi or rales.  Chest:     Chest wall: No tenderness.  Abdominal:     General: Bowel sounds are normal.     Palpations: Abdomen is soft.  Musculoskeletal:        General: Normal range of motion.     Cervical back: Normal range of motion.  Skin:    General: Skin is warm and dry.  Neurological:     Mental Status: She is alert and oriented to person, place, and time.     Coordination: Coordination normal.  Psychiatric:        Behavior: Behavior normal. Behavior is cooperative.        Thought Content: Thought content normal.        Judgment: Judgment normal.          Patient has been counseled extensively about nutrition and exercise as well as the importance of adherence with medications and regular follow-up. The patient was given clear instructions to go to ER or return to medical center if  symptoms don't improve, worsen or new problems develop. The patient verbalized understanding.   Follow-up: Return for 3 weeks BP Check doublebook 1110 or 130.   [name], FNP-BC [facility] and [place], [place]   06/06/2022, 9:18 AM

## 2022-06-07 LAB — CMP14+EGFR
ALT: 14 IU/L (ref 0–32)
AST: 16 IU/L (ref 0–40)
Albumin/Globulin Ratio: 1.9 (ref 1.2–2.2)
Albumin: 4.5 g/dL (ref 3.9–4.9)
Alkaline Phosphatase: 73 IU/L (ref 44–121)
BUN/Creatinine Ratio: 20 (ref 9–23)
BUN: 13 mg/dL (ref 6–24)
Bilirubin Total: 0.3 mg/dL (ref 0.0–1.2)
CO2: 24 mmol/L (ref 20–29)
Calcium: 9.6 mg/dL (ref 8.7–10.2)
Chloride: 103 mmol/L (ref 96–106)
Creatinine, Ser: 0.64 mg/dL (ref 0.57–1.00)
Globulin, Total: 2.4 g/dL (ref 1.5–4.5)
Glucose: 92 mg/dL (ref 70–99)
Potassium: 3.9 mmol/L (ref 3.5–5.2)
Sodium: 140 mmol/L (ref 134–144)
Total Protein: 6.9 g/dL (ref 6.0–8.5)
eGFR: 108 mL/min/{1.73_m2} (ref >59)

## 2022-06-07 LAB — LIPID PANEL
Chol/HDL Ratio: 3 ratio (ref 0.0–4.4)
Cholesterol, Total: 167 mg/dL (ref 100–199)
HDL: 55 mg/dL (ref >39)
LDL Chol Calc (NIH): 91 mg/dL (ref 0–99)
Triglycerides: 119 mg/dL (ref 0–149)
VLDL Cholesterol Cal: 21 mg/dL (ref 5–40)

## 2022-06-07 LAB — HEMOGLOBIN A1C
Est. average glucose Bld gHb Est-mCnc: 117 mg/dL
Hgb A1c MFr Bld: 5.7 % — ABNORMAL HIGH (ref 4.8–5.6)

## 2022-06-27 ENCOUNTER — Encounter: Payer: Self-pay | Admitting: Nurse Practitioner

## 2022-06-27 ENCOUNTER — Ambulatory Visit: Payer: Managed Care, Other (non HMO) | Attending: Nurse Practitioner | Admitting: Nurse Practitioner

## 2022-06-27 DIAGNOSIS — I10 Essential (primary) hypertension: Secondary | ICD-10-CM | POA: Diagnosis not present

## 2022-06-27 MED ORDER — AMLODIPINE BESYLATE 10 MG PO TABS: 10.0000 mg | ORAL_TABLET | Freq: Every day | ORAL | 0 refills | Status: DC

## 2022-06-27 NOTE — Progress Notes (Signed)
Assessment & Plan:  [name] was seen today for hypertension.  Diagnoses and all orders for this visit:  Primary hypertension -     amLODipine (NORVASC) 10 MG tablet; Take 1 tablet (10 mg total) by mouth daily. Please fill as a 90 day supply Continue valsartan DASH DIET    Patient has been counseled on age-appropriate routine health concerns for screening and prevention. These are reviewed and up-to-date. Referrals have been placed accordingly. Immunizations are up-to-date or declined.    Subjective:   Chief Complaint  Patient presents with   Hypertension   HPI [name] 52 y.o. female presents to office today for follow up to HTN   HTN Blood pressure continues elevated despite taking amlodipine 5 mg daily and valsartan 40 mg daily.  At this time we will increase amlodipine and she will continue on valsartan 40 mg.  She has a blood pressure log with her today and average blood pressure reading 140/90-100.  She BP Readings from Last 3 Encounters:  06/27/22 (!) 148/100  06/06/22 (!) 140/98  03/04/22 (!) 149/91     Review of Systems  Constitutional:  Negative for fever, malaise/fatigue and weight loss.  HENT: Negative.  Negative for nosebleeds.   Eyes: Negative.  Negative for blurred vision, double vision and photophobia.  Respiratory: Negative.  Negative for cough and shortness of breath.   Cardiovascular: Negative.  Negative for chest pain, palpitations and leg swelling.  Gastrointestinal: Negative.  Negative for heartburn, nausea and vomiting.  Musculoskeletal: Negative.  Negative for myalgias.  Neurological: Negative.  Negative for dizziness, focal weakness, seizures and headaches.  Psychiatric/Behavioral: Negative.  Negative for suicidal ideas.     Past Medical History:  Diagnosis Date   Asthma 1990   uses inhalers PRN   Hypertension 2005   on meds   Seizures ([place])    childhood-"my mother said I just grew out of them and have not had any more since  I was a yound girl"    Past Surgical History:  Procedure Laterality Date   CHOLECYSTECTOMY     EYE SURGERY Left    TUBAL LIGATION      Family History  Problem Relation Age of Onset   Hypertension Mother    Heart disease Father    Diabetes Maternal Grandfather    Colon polyps Neg Hx    Colon cancer Neg Hx    Esophageal cancer Neg Hx    Rectal cancer Neg Hx    Stomach cancer Neg Hx     Social History Reviewed with no changes to be made today.   Outpatient Medications Prior to Visit  Medication Sig Dispense Refill   albuterol (PROAIR HFA) 108 (90 Base) MCG/ACT inhaler Inhale 2 puffs into the lungs every 6 (six) hours as needed for wheezing or shortness of breath. 8.5 g 2   Blood Pressure Monitor DEVI Please provide patient with insurance approved blood pressure monitor 1 Device 0   PROAIR HFA 108 (90 Base) MCG/ACT inhaler TAKE 2 PUFFS BY MOUTH EVERY 6 HOURS AS NEEDED FOR WHEEZE OR SHORTNESS OF BREATH **INS REQ PROAIR 8.5 each 0   triamcinolone ointment (KENALOG) 0.5 % Apply 1 Application topically 2 (two) times daily. To back 60 g 3   valsartan (DIOVAN) 40 MG tablet Take 1 tablet (40 mg total) by mouth daily. Please fill as a 90 day supply 90 tablet 1   amLODipine (NORVASC) 5 MG tablet Take 1 tablet (5 mg total) by mouth daily. Please fill as  a 90 day supply 90 tablet 1   No facility-administered medications prior to visit.    No Known Allergies     Objective:    BP (!) 148/100 Comment: Patient bp monitor 156/100  Pulse 75   Ht 5' 0.5" (1.537 m)   Wt 153 lb (69.4 kg)   LMP 05/15/2022 (Approximate)   SpO2 100%   BMI 29.39 kg/m  Wt Readings from Last 3 Encounters:  06/27/22 153 lb (69.4 kg)  06/06/22 151 lb (68.5 kg)  03/04/22 157 lb 3.2 oz (71.3 kg)    Physical Exam Constitutional:      Appearance: She is well-developed.  HENT:     Head: Normocephalic and atraumatic.     Right Ear: Hearing, tympanic membrane, ear canal and external ear normal.     Left Ear:  Hearing, tympanic membrane, ear canal and external ear normal.     Nose: Nose normal.     Right Turbinates: Not enlarged.     Left Turbinates: Not enlarged.     Mouth/Throat:     Lips: Pink.     Mouth: Mucous membranes are moist.     Dentition: No dental tenderness, gingival swelling, dental abscesses or gum lesions.     Pharynx: No oropharyngeal exudate.  Eyes:     General: No scleral icterus.       Right eye: No discharge.     Extraocular Movements: Extraocular movements intact.     Conjunctiva/sclera: Conjunctivae normal.     Pupils: Pupils are equal, round, and reactive to light.  Neck:     Thyroid: No thyromegaly.     Trachea: No tracheal deviation.  Cardiovascular:     Rate and Rhythm: Normal rate and regular rhythm.     Heart sounds: Normal heart sounds. No murmur heard.    No friction rub.  Pulmonary:     Effort: Pulmonary effort is normal. No accessory muscle usage or respiratory distress.     Breath sounds: Normal breath sounds. No decreased breath sounds, wheezing, rhonchi or rales.  Abdominal:     General: Bowel sounds are normal. There is no distension.     Palpations: Abdomen is soft. There is no mass.     Tenderness: There is no abdominal tenderness. There is no right CVA tenderness, left CVA tenderness, guarding or rebound.     Hernia: No hernia is present.  Musculoskeletal:        General: No tenderness or deformity. Normal range of motion.     Cervical back: Normal range of motion and neck supple.  Lymphadenopathy:     Cervical: No cervical adenopathy.  Skin:    General: Skin is warm and dry.     Findings: No erythema.  Neurological:     Mental Status: She is alert and oriented to person, place, and time.     Cranial Nerves: No cranial nerve deficit.     Motor: Motor function is intact.     Coordination: Coordination is intact. Coordination normal.     Gait: Gait is intact.     Deep Tendon Reflexes:     Reflex Scores:      Patellar reflexes are 1+ on  the right side and 1+ on the left side. Psychiatric:        Attention and Perception: Attention normal.        Mood and Affect: Mood normal.        Speech: Speech normal.        Behavior: Behavior normal.  Thought Content: Thought content normal.        Judgment: Judgment normal.          Patient has been counseled extensively about nutrition and exercise as well as the importance of adherence with medications and regular follow-up. The patient was given clear instructions to go to ER or return to medical center if symptoms don't improve, worsen or new problems develop. The patient verbalized understanding.   Follow-up: Return in about 6 weeks (around 08/08/2022) for BP CHECK.   [name], FNP-BC [facility] and [facility] [place], [place]   06/27/2022, 12:25 PM

## 2022-08-09 ENCOUNTER — Encounter: Payer: Self-pay | Admitting: Nurse Practitioner

## 2022-08-09 ENCOUNTER — Ambulatory Visit: Payer: Managed Care, Other (non HMO) | Attending: Nurse Practitioner | Admitting: Nurse Practitioner

## 2022-08-09 VITALS — BP 131/87 | HR 78 | Ht 60.5 in | Wt 152.6 lb

## 2022-08-09 DIAGNOSIS — I10 Essential (primary) hypertension: Secondary | ICD-10-CM

## 2022-08-09 DIAGNOSIS — J984 Other disorders of lung: Secondary | ICD-10-CM | POA: Diagnosis not present

## 2022-08-09 MED ORDER — VALSARTAN 80 MG PO TABS: 80.0000 mg | ORAL_TABLET | Freq: Every day | ORAL | 1 refills | Status: DC

## 2022-08-09 MED ORDER — ALBUTEROL SULFATE HFA 108 (90 BASE) MCG/ACT IN AERS: 2.0000 | INHALATION_SPRAY | Freq: Four times a day (QID) | RESPIRATORY_TRACT | 2 refills | Status: AC | PRN

## 2022-08-09 NOTE — Progress Notes (Signed)
Assessment & Plan:  [name] was seen today for hypertension.  Diagnoses and all orders for this visit:  Primary hypertension DOSE CHANGE. Increased valsartan  -     Basic metabolic panel -     valsartan (DIOVAN) 80 MG tablet; Take 1 tablet (80 mg total) by mouth daily. Please fill as a 90 day supply  Restrictive airway disease -     albuterol (PROAIR HFA) 108 (90 Base) MCG/ACT inhaler; Inhale 2 puffs into the lungs every 6 (six) hours as needed for wheezing or shortness of breath.    Patient has been counseled on age-appropriate routine health concerns for screening and prevention. These are reviewed and up-to-date. Referrals have been placed accordingly. Immunizations are up-to-date or declined.    Subjective:   Chief Complaint  Patient presents with   Hypertension   HPI [name] 52 y.o. female presents to office today for folow up to HTN   HTN Blood pressure is improved. Instead of her increasing her amlodipine from 5mg  to 10 mg at her last visit a few weeks ago she increased her valsartan from 40 mg to 80mg . At this time we will keep her on the above changes. BP Readings from Last 3 Encounters:  08/09/22 131/87  06/27/22 (!) 148/100  06/06/22 (!) 140/98     Review of Systems  Constitutional:  Negative for fever, malaise/fatigue and weight loss.  HENT: Negative.  Negative for nosebleeds.   Eyes: Negative.  Negative for blurred vision, double vision and photophobia.  Respiratory: Negative.  Negative for cough and shortness of breath.   Cardiovascular: Negative.  Negative for chest pain, palpitations and leg swelling.  Gastrointestinal: Negative.  Negative for heartburn, nausea and vomiting.  Musculoskeletal: Negative.  Negative for myalgias.  Neurological: Negative.  Negative for dizziness, focal weakness, seizures and headaches.  Psychiatric/Behavioral: Negative.  Negative for suicidal ideas.     Past Medical History:  Diagnosis Date   Asthma 1990    uses inhalers PRN   Hypertension 2005   on meds   Seizures ([facility])    childhood-"my mother said I just grew out of them and have not had any more since I was a yound girl"    Past Surgical History:  Procedure Laterality Date   CHOLECYSTECTOMY     EYE SURGERY Left    TUBAL LIGATION      Family History  Problem Relation Age of Onset   Hypertension Mother    Heart disease Father    Diabetes Maternal Grandfather    Colon polyps Neg Hx    Colon cancer Neg Hx    Esophageal cancer Neg Hx    Rectal cancer Neg Hx    Stomach cancer Neg Hx     Social History Reviewed with no changes to be made today.   Outpatient Medications Prior to Visit  Medication Sig Dispense Refill   amLODipine (NORVASC) 10 MG tablet Take 1 tablet (10 mg total) by mouth daily. Please fill as a 90 day supply (Patient taking differently: Take 5 mg by mouth daily. Please fill as a 90 day supply) 90 tablet 0   Blood Pressure Monitor DEVI Please provide patient with insurance approved blood pressure monitor 1 Device 0   triamcinolone ointment (KENALOG) 0.5 % Apply 1 Application topically 2 (two) times daily. To back 60 g 3   valsartan (DIOVAN) 40 MG tablet Take 1 tablet (40 mg total) by mouth daily. Please fill as a 90 day supply 90 tablet 1  albuterol (PROAIR HFA) 108 (90 Base) MCG/ACT inhaler Inhale 2 puffs into the lungs every 6 (six) hours as needed for wheezing or shortness of breath. (Patient not taking: Reported on 08/09/2022) 8.5 g 2   PROAIR HFA 108 (90 Base) MCG/ACT inhaler TAKE 2 PUFFS BY MOUTH EVERY 6 HOURS AS NEEDED FOR WHEEZE OR SHORTNESS OF BREATH **INS REQ PROAIR (Patient not taking: Reported on 08/09/2022) 8.5 each 0   No facility-administered medications prior to visit.    No Known Allergies     Objective:    BP 131/87 (BP Location: Left Arm, Patient Position: Sitting, Cuff Size: Normal)   Pulse 78   Ht 5' 0.5" (1.537 m)   Wt 152 lb 9.6 oz (69.2 kg)   LMP 08/08/2022 (Exact Date)   SpO2 98%    BMI 29.31 kg/m  Wt Readings from Last 3 Encounters:  08/09/22 152 lb 9.6 oz (69.2 kg)  06/27/22 153 lb (69.4 kg)  06/06/22 151 lb (68.5 kg)    Physical Exam Vitals and nursing note reviewed.  Constitutional:      Appearance: She is well-developed.  HENT:     Head: Normocephalic and atraumatic.  Cardiovascular:     Rate and Rhythm: Normal rate and regular rhythm.     Heart sounds: Normal heart sounds. No murmur heard.    No friction rub. No gallop.  Pulmonary:     Effort: Pulmonary effort is normal. No tachypnea or respiratory distress.     Breath sounds: Normal breath sounds. No decreased breath sounds, wheezing, rhonchi or rales.  Chest:     Chest wall: No tenderness.  Abdominal:     General: Bowel sounds are normal.     Palpations: Abdomen is soft.  Musculoskeletal:        General: Normal range of motion.     Cervical back: Normal range of motion.  Skin:    General: Skin is warm and dry.  Neurological:     Mental Status: She is alert and oriented to person, place, and time.     Coordination: Coordination normal.  Psychiatric:        Behavior: Behavior normal. Behavior is cooperative.        Thought Content: Thought content normal.        Judgment: Judgment normal.          Patient has been counseled extensively about nutrition and exercise as well as the importance of adherence with medications and regular follow-up. The patient was given clear instructions to go to ER or return to medical center if symptoms don't improve, worsen or new problems develop. The patient verbalized understanding.   Follow-up: Return in about 3 months (around 11/09/2022).   Claiborne Rigg, FNP-BC [facility] and [facility] [place], [place] [phone]   08/09/2022, 9:52 AM

## 2022-08-10 LAB — BASIC METABOLIC PANEL
BUN/Creatinine Ratio: 25 — ABNORMAL HIGH (ref 9–23)
BUN: 14 mg/dL (ref 6–24)
CO2: 19 mmol/L — ABNORMAL LOW (ref 20–29)
Calcium: 9.7 mg/dL (ref 8.7–10.2)
Chloride: 104 mmol/L (ref 96–106)
Creatinine, Ser: 0.57 mg/dL (ref 0.57–1.00)
Glucose: 96 mg/dL (ref 70–99)
Potassium: 4.2 mmol/L (ref 3.5–5.2)
Sodium: 140 mmol/L (ref 134–144)
eGFR: 111 mL/min/{1.73_m2} (ref >59)

## 2022-11-04 ENCOUNTER — Other Ambulatory Visit: Payer: Self-pay | Admitting: Nurse Practitioner

## 2022-11-04 DIAGNOSIS — Z1231 Encounter for screening mammogram for malignant neoplasm of breast: Secondary | ICD-10-CM

## 2022-11-11 ENCOUNTER — Encounter: Payer: Self-pay | Admitting: Nurse Practitioner

## 2022-11-11 ENCOUNTER — Ambulatory Visit: Payer: Managed Care, Other (non HMO) | Attending: Nurse Practitioner | Admitting: Nurse Practitioner

## 2022-11-11 VITALS — BP 131/84 | HR 78 | Ht 60.5 in | Wt 159.2 lb

## 2022-11-11 DIAGNOSIS — I10 Essential (primary) hypertension: Secondary | ICD-10-CM

## 2022-11-11 DIAGNOSIS — R7303 Prediabetes: Secondary | ICD-10-CM

## 2022-11-11 MED ORDER — AMLODIPINE BESYLATE 5 MG PO TABS: 5.0000 mg | ORAL_TABLET | Freq: Every day | ORAL | 1 refills | Status: DC

## 2022-11-11 NOTE — Progress Notes (Signed)
Assessment & Plan:  Glodine was seen today for medical management of chronic issues.  Diagnoses and all orders for this visit:  Primary hypertension -     CMP14+EGFR -     amLODipine (NORVASC) 5 MG tablet; Take 1 tablet (5 mg total) by mouth daily. Please fill as a 90 day supply Continue all antihypertensives as prescribed.  Reminded to bring in blood pressure log for follow  up appointment.  RECOMMENDATIONS: DASH/Mediterranean Diets are healthier choices for HTN.    Prediabetes -     Hemoglobin A1c -     CMP14+EGFR     Patient has been counseled on age-appropriate routine health concerns for screening and prevention. These are reviewed and up-to-date. Referrals have been placed accordingly. Immunizations are up-to-date or declined.    Subjective:   Chief Complaint  Patient presents with   Medical Management of Chronic Issues   HPI [name] 52 y.o. female presents to office today for follow up to HTN  She has a past medical history of Asthma (1990), Hypertension (2005), and Seizures   HTN Blood pressure is improved. She is currently taking amlodipine 5mg  and valsartan 80mg .  BP Readings from Last 3 Encounters:  11/11/22 131/84  08/09/22 131/87  06/27/22 (!) 148/100    Review of Systems  Constitutional:  Negative for fever, malaise/fatigue and weight loss.  HENT: Negative.  Negative for nosebleeds.   Eyes: Negative.  Negative for blurred vision, double vision and photophobia.  Respiratory: Negative.  Negative for cough and shortness of breath.   Cardiovascular: Negative.  Negative for chest pain, palpitations and leg swelling.  Gastrointestinal: Negative.  Negative for heartburn, nausea and vomiting.  Musculoskeletal: Negative.  Negative for myalgias.  Neurological: Negative.  Negative for dizziness, focal weakness, seizures and headaches.  Psychiatric/Behavioral: Negative.  Negative for suicidal ideas.     Past Medical History:  Diagnosis Date    Asthma 1990   uses inhalers PRN   Hypertension 2005   on meds   Seizures (HCC)    childhood-"my mother said I just grew out of them and have not had any more since I was a yound girl"    Past Surgical History:  Procedure Laterality Date   CHOLECYSTECTOMY     EYE SURGERY Left    TUBAL LIGATION      Family History  Problem Relation Age of Onset   Hypertension Mother    Heart disease Father    Diabetes Maternal Grandfather    Colon polyps Neg Hx    Colon cancer Neg Hx    Esophageal cancer Neg Hx    Rectal cancer Neg Hx    Stomach cancer Neg Hx     Social History Reviewed with no changes to be made today.   Outpatient Medications Prior to Visit  Medication Sig Dispense Refill   albuterol (PROAIR HFA) 108 (90 Base) MCG/ACT inhaler Inhale 2 puffs into the lungs every 6 (six) hours as needed for wheezing or shortness of breath. 8.5 g 2   Blood Pressure Monitor DEVI Please provide patient with insurance approved blood pressure monitor 1 Device 0   triamcinolone ointment (KENALOG) 0.5 % Apply 1 Application topically 2 (two) times daily. To back 60 g 3   valsartan (DIOVAN) 80 MG tablet Take 1 tablet (80 mg total) by mouth daily. Please fill as a 90 day supply 90 tablet 1   amLODipine (NORVASC) 10 MG tablet Take 1 tablet (10 mg total) by mouth daily. Please fill as  a 90 day supply (Patient taking differently: Take 5 mg by mouth daily. Please fill as a 90 day supply) 90 tablet 0   No facility-administered medications prior to visit.    No Known Allergies     Objective:    BP 131/84 (BP Location: Left Arm, Patient Position: Sitting, Cuff Size: Normal)   Pulse 78   Ht 5' 0.5" (1.537 m)   Wt 159 lb 3.2 oz (72.2 kg)   LMP 11/04/2022   SpO2 100%   BMI 30.58 kg/m  Wt Readings from Last 3 Encounters:  11/11/22 159 lb 3.2 oz (72.2 kg)  08/09/22 152 lb 9.6 oz (69.2 kg)  06/27/22 153 lb (69.4 kg)    Physical Exam Vitals and nursing note reviewed.  Constitutional:       Appearance: She is well-developed.  HENT:     Head: Normocephalic and atraumatic.  Cardiovascular:     Rate and Rhythm: Normal rate and regular rhythm.     Heart sounds: Normal heart sounds. No murmur heard.    No friction rub. No gallop.  Pulmonary:     Effort: Pulmonary effort is normal. No tachypnea or respiratory distress.     Breath sounds: Normal breath sounds. No decreased breath sounds, wheezing, rhonchi or rales.  Chest:     Chest wall: No tenderness.  Abdominal:     General: Bowel sounds are normal.     Palpations: Abdomen is soft.  Musculoskeletal:        General: Normal range of motion.     Cervical back: Normal range of motion.  Skin:    General: Skin is warm and dry.  Neurological:     Mental Status: She is alert and oriented to person, place, and time.     Coordination: Coordination normal.  Psychiatric:        Behavior: Behavior normal. Behavior is cooperative.        Thought Content: Thought content normal.        Judgment: Judgment normal.          Patient has been counseled extensively about nutrition and exercise as well as the importance of adherence with medications and regular follow-up. The patient was given clear instructions to go to ER or return to medical center if symptoms don't improve, worsen or new problems develop. The patient verbalized understanding.   Follow-up: Return in about 3 months (around 02/11/2023).   Claiborne Rigg, FNP-BC [facility] and [facility] [place], [place] [phone]   11/11/2022, 9:47 AM

## 2022-11-16 ENCOUNTER — Ambulatory Visit
Admission: RE | Admit: 2022-11-16 | Discharge: 2022-11-16 | Disposition: A | Discharge status: Home / Self Care | Payer: Managed Care, Other (non HMO) | Source: Ambulatory Visit | Attending: Nurse Practitioner | Admitting: Nurse Practitioner

## 2022-11-16 DIAGNOSIS — Z1231 Encounter for screening mammogram for malignant neoplasm of breast: Secondary | ICD-10-CM

## 2023-02-06 ENCOUNTER — Other Ambulatory Visit: Payer: Self-pay | Admitting: Nurse Practitioner

## 2023-02-06 DIAGNOSIS — I10 Essential (primary) hypertension: Secondary | ICD-10-CM

## 2023-02-17 ENCOUNTER — Ambulatory Visit: Payer: Managed Care, Other (non HMO) | Attending: Nurse Practitioner | Admitting: Nurse Practitioner

## 2023-02-17 ENCOUNTER — Encounter: Payer: Self-pay | Admitting: Nurse Practitioner

## 2023-02-17 VITALS — BP 148/98 | HR 70 | Ht 60.5 in | Wt 161.0 lb

## 2023-02-17 DIAGNOSIS — R221 Localized swelling, mass and lump, neck: Secondary | ICD-10-CM | POA: Diagnosis not present

## 2023-02-17 DIAGNOSIS — I10 Essential (primary) hypertension: Secondary | ICD-10-CM | POA: Diagnosis not present

## 2023-02-17 NOTE — Progress Notes (Signed)
Assessment & Plan:  [name] was seen today for medical management of chronic issues.  Diagnoses and all orders for this visit:  Primary hypertension Continue all antihypertensives as prescribed.  Reminded to bring in blood pressure log for follow  up appointment.  RECOMMENDATIONS: DASH/Mediterranean Diets are healthier choices for HTN.    Fullness of neck -     US THYROID; Future    Patient has been counseled on age-appropriate routine health concerns for screening and prevention. These are reviewed and up-to-date. Referrals have been placed accordingly. Immunizations are up-to-date or declined.    Subjective:   Chief Complaint  Patient presents with   Medical Management of Chronic Issues    [name] 52 y.o. female presents to office today for follow-up to hypertension.  She has some neck fullness on physical exam today.  We will order an ultrasound for this.  Patient has been counseled on age-appropriate routine health concerns for screening and prevention. These are reviewed and up-to-date. Referrals have been placed accordingly. Immunizations are up-to-date or declined.     Mammogram: Up-to-date Pap smear: Up-to-date Colonoscopy: Up-to-date  HTN Blood pressure is elevated today.  She is taking amlodipine 5 mg daily as prescribed and valsartan 80 mg daily.  Will have her monitor blood pressures at home and bring log in for next visit. BP Readings from Last 3 Encounters:  02/17/23 (!) 148/98  11/11/22 131/84  08/09/22 131/87     Review of Systems  Constitutional:  Negative for fever, malaise/fatigue and weight loss.  HENT: Negative.  Negative for nosebleeds.   Eyes: Negative.  Negative for blurred vision, double vision and photophobia.  Respiratory: Negative.  Negative for cough and shortness of breath.   Cardiovascular: Negative.  Negative for chest pain, palpitations and leg swelling.  Gastrointestinal: Negative.  Negative for heartburn, nausea and  vomiting.  Musculoskeletal: Negative.  Negative for myalgias.  Neurological: Negative.  Negative for dizziness, focal weakness, seizures and headaches.  Psychiatric/Behavioral: Negative.  Negative for suicidal ideas.     Past Medical History:  Diagnosis Date   Asthma 1990   uses inhalers PRN   Hypertension 2005   on meds   Seizures (HCC)    childhood-"my mother said I just grew out of them and have not had any more since I was a yound girl"    Past Surgical History:  Procedure Laterality Date   CHOLECYSTECTOMY     EYE SURGERY Left    TUBAL LIGATION      Family History  Problem Relation Age of Onset   Hypertension Mother    Heart disease Father    Diabetes Maternal Grandfather    Colon polyps Neg Hx    Colon cancer Neg Hx    Esophageal cancer Neg Hx    Rectal cancer Neg Hx    Stomach cancer Neg Hx     Social History Reviewed with no changes to be made today.   Outpatient Medications Prior to Visit  Medication Sig Dispense Refill   albuterol (PROAIR HFA) 108 (90 Base) MCG/ACT inhaler Inhale 2 puffs into the lungs every 6 (six) hours as needed for wheezing or shortness of breath. 8.5 g 2   amLODipine (NORVASC) 5 MG tablet Take 1 tablet (5 mg total) by mouth daily. Please fill as a 90 day supply 90 tablet 1   Blood Pressure Monitor DEVI Please provide patient with insurance approved blood pressure monitor 1 Device 0   triamcinolone ointment (KENALOG) 0.5 % Apply 1 Application  topically 2 (two) times daily. To back 60 g 3   valsartan (DIOVAN) 80 MG tablet TAKE 1 TABLET (80 MG TOTAL) BY MOUTH DAILY 90 tablet 0   No facility-administered medications prior to visit.    No Known Allergies     Objective:    BP (!) 148/98   Pulse 70   Ht 5' 0.5" (1.537 m)   Wt 161 lb (73 kg)   BMI 30.93 kg/m  Wt Readings from Last 3 Encounters:  02/17/23 161 lb (73 kg)  11/11/22 159 lb 3.2 oz (72.2 kg)  08/09/22 152 lb 9.6 oz (69.2 kg)    Physical Exam Vitals and nursing note  reviewed.  Constitutional:      Appearance: She is well-developed.  HENT:     Head: Normocephalic and atraumatic.  Neck:     Thyroid: Thyromegaly present. No thyroid mass or thyroid tenderness.  Cardiovascular:     Rate and Rhythm: Normal rate and regular rhythm.     Heart sounds: Normal heart sounds. No murmur heard.    No friction rub. No gallop.  Pulmonary:     Effort: Pulmonary effort is normal. No tachypnea or respiratory distress.     Breath sounds: Normal breath sounds. No decreased breath sounds, wheezing, rhonchi or rales.  Chest:     Chest wall: No tenderness.  Abdominal:     General: Bowel sounds are normal.     Palpations: Abdomen is soft.  Musculoskeletal:        General: Normal range of motion.     Cervical back: Normal range of motion.  Skin:    General: Skin is warm and dry.  Neurological:     Mental Status: She is alert and oriented to person, place, and time.     Coordination: Coordination normal.  Psychiatric:        Behavior: Behavior normal. Behavior is cooperative.        Thought Content: Thought content normal.        Judgment: Judgment normal.          Patient has been counseled extensively about nutrition and exercise as well as the importance of adherence with medications and regular follow-up. The patient was given clear instructions to go to ER or return to medical center if symptoms don't improve, worsen or new problems develop. The patient verbalized understanding.   Follow-up: Return in about 3 months (around 05/20/2023).   Claiborne Rigg, FNP-BC [facility] and [facility] [place], [place] [phone]   02/27/2023, 9:57 AM

## 2023-02-21 ENCOUNTER — Ambulatory Visit
Admission: RE | Admit: 2023-02-21 | Discharge: 2023-02-21 | Disposition: A | Discharge status: Home / Self Care | Payer: Managed Care, Other (non HMO) | Source: Ambulatory Visit | Attending: Nurse Practitioner | Admitting: Nurse Practitioner

## 2023-02-21 DIAGNOSIS — R221 Localized swelling, mass and lump, neck: Secondary | ICD-10-CM

## 2023-05-10 ENCOUNTER — Other Ambulatory Visit: Payer: Self-pay | Admitting: Family Medicine

## 2023-05-10 DIAGNOSIS — I10 Essential (primary) hypertension: Secondary | ICD-10-CM

## 2023-05-26 ENCOUNTER — Ambulatory Visit: Payer: Managed Care, Other (non HMO) | Admitting: Nurse Practitioner

## 2023-06-05 ENCOUNTER — Ambulatory Visit: Payer: Managed Care, Other (non HMO) | Admitting: Nurse Practitioner

## 2023-06-12 ENCOUNTER — Other Ambulatory Visit: Payer: Self-pay | Admitting: Nurse Practitioner

## 2023-06-12 DIAGNOSIS — I10 Essential (primary) hypertension: Secondary | ICD-10-CM

## 2023-06-19 ENCOUNTER — Encounter: Payer: Self-pay | Admitting: Nurse Practitioner

## 2023-06-19 ENCOUNTER — Ambulatory Visit: Payer: Managed Care, Other (non HMO) | Attending: Nurse Practitioner | Admitting: Nurse Practitioner

## 2023-06-19 VITALS — BP 151/100 | HR 80 | Resp 19 | Ht 60.5 in | Wt 162.0 lb

## 2023-06-19 DIAGNOSIS — R7989 Other specified abnormal findings of blood chemistry: Secondary | ICD-10-CM | POA: Diagnosis not present

## 2023-06-19 DIAGNOSIS — R7303 Prediabetes: Secondary | ICD-10-CM

## 2023-06-19 DIAGNOSIS — I10 Essential (primary) hypertension: Secondary | ICD-10-CM

## 2023-06-19 MED ORDER — VALSARTAN-HYDROCHLOROTHIAZIDE 80-12.5 MG PO TABS
1.0000 | ORAL_TABLET | Freq: Every day | ORAL | 3 refills | Status: AC
Start: 2023-06-19 — End: ?

## 2023-06-19 NOTE — Progress Notes (Signed)
 Assessment & Plan:  [name] was seen today for medical management of chronic issues.  Diagnoses and all orders for this visit:  Primary hypertension DOSE CHANGE with valsartan. Continue amlodipine. Follow up in 4 weeks with BP log.  -     CMP14+EGFR -     valsartan-hydrochlorothiazide (DIOVAN-HCT) 80-12.5 MG tablet; Take 1 tablet by mouth daily.  Abnormal CBC -     CBC with Differential  Prediabetes -     Hemoglobin A1c    Patient has been counseled on age-appropriate routine health concerns for screening and prevention. These are reviewed and up-to-date. Referrals have been placed accordingly. Immunizations are up-to-date or declined.    Subjective:   Chief Complaint  Patient presents with   Medical Management of Chronic Issues    [name] 53 y.o. female presents to office today for follow up to HTN  She has a history of prediabetes and HTN   Patient has been counseled on age-appropriate routine health concerns for screening and prevention. These are reviewed and up-to-date. Referrals have been placed accordingly. Immunizations are up-to-date or declined.     Mammogram: Up-to-date Pap smear: Up-to-date Colonoscopy: Up-to-date  HTN Blood pressure is elevated today. She is taking valsartan 80 mg daily and amlodipine 5 mg daily as prescribed. Has not been monitoring her blood pressure at home despite having a blood pressure monitor.  BP Readings from Last 3 Encounters:  06/19/23 (!) 160/102  02/17/23 (!) 148/98  11/11/22 131/84      Review of Systems  Constitutional:  Negative for fever, malaise/fatigue and weight loss.  HENT: Negative.  Negative for nosebleeds.   Eyes: Negative.  Negative for blurred vision, double vision and photophobia.  Respiratory: Negative.  Negative for cough and shortness of breath.   Cardiovascular: Negative.  Negative for chest pain, palpitations and leg swelling.  Gastrointestinal: Negative.  Negative for heartburn, nausea  and vomiting.  Musculoskeletal: Negative.  Negative for myalgias.  Neurological: Negative.  Negative for dizziness, focal weakness, seizures and headaches.  Psychiatric/Behavioral: Negative.  Negative for suicidal ideas.     Past Medical History:  Diagnosis Date   Asthma 1990   uses inhalers PRN   Hypertension 2005   on meds   Seizures (HCC)    childhood-"my mother said I just grew out of them and have not had any more since I was a yound girl"    Past Surgical History:  Procedure Laterality Date   CHOLECYSTECTOMY     EYE SURGERY Left    TUBAL LIGATION      Family History  Problem Relation Age of Onset   Hypertension Mother    Heart disease Father    Diabetes Maternal Grandfather    Colon polyps Neg Hx    Colon cancer Neg Hx    Esophageal cancer Neg Hx    Rectal cancer Neg Hx    Stomach cancer Neg Hx     Social History Reviewed with no changes to be made today.   Outpatient Medications Prior to Visit  Medication Sig Dispense Refill   albuterol (PROAIR HFA) 108 (90 Base) MCG/ACT inhaler Inhale 2 puffs into the lungs every 6 (six) hours as needed for wheezing or shortness of breath. 8.5 g 2   amLODipine (NORVASC) 5 MG tablet TAKE 1 TABLET (5 MG TOTAL) BY MOUTH DAILY. PLEASE FILL AS A 90 DAY SUPPLY 90 tablet 1   Blood Pressure Monitor DEVI Please provide patient with insurance approved blood pressure monitor 1 Device 0  triamcinolone ointment (KENALOG) 0.5 % Apply 1 Application topically 2 (two) times daily. To back 60 g 3   valsartan (DIOVAN) 80 MG tablet TAKE 1 TABLET BY MOUTH EVERY DAY 90 tablet 0   No facility-administered medications prior to visit.    No Known Allergies     Objective:    BP (!) 160/102 (BP Location: Left Arm, Patient Position: Sitting, Cuff Size: Normal)   Pulse 80   Resp 19   Ht 5' 0.5" (1.537 m)   Wt 162 lb (73.5 kg)   SpO2 98%   BMI 31.12 kg/m  Wt Readings from Last 3 Encounters:  06/19/23 162 lb (73.5 kg)  02/17/23 161 lb (73 kg)   11/11/22 159 lb 3.2 oz (72.2 kg)    Physical Exam Vitals and nursing note reviewed.  Constitutional:      Appearance: She is well-developed.  HENT:     Head: Normocephalic and atraumatic.  Cardiovascular:     Rate and Rhythm: Normal rate and regular rhythm.     Heart sounds: Normal heart sounds. No murmur heard.    No friction rub. No gallop.  Pulmonary:     Effort: Pulmonary effort is normal. No tachypnea or respiratory distress.     Breath sounds: Normal breath sounds. No decreased breath sounds, wheezing, rhonchi or rales.  Chest:     Chest wall: No tenderness.  Abdominal:     General: Bowel sounds are normal.     Palpations: Abdomen is soft.  Musculoskeletal:        General: Normal range of motion.     Cervical back: Normal range of motion.  Skin:    General: Skin is warm and dry.  Neurological:     Mental Status: She is alert and oriented to person, place, and time.     Coordination: Coordination normal.  Psychiatric:        Behavior: Behavior normal. Behavior is cooperative.        Thought Content: Thought content normal.        Judgment: Judgment normal.          Patient has been counseled extensively about nutrition and exercise as well as the importance of adherence with medications and regular follow-up. The patient was given clear instructions to go to ER or return to medical center if symptoms don't improve, worsen or new problems develop. The patient verbalized understanding.   Follow-up: Return in about 4 weeks (around 07/17/2023) for BP recheck with me.   Claiborne Rigg, FNP-BC [facility] and [facility] [place], [place] [phone]   06/19/2023, 9:13 AM

## 2023-06-20 LAB — CBC WITH DIFFERENTIAL/PLATELET
Basophils Absolute: 0 10*3/uL (ref 0.0–0.2)
Basos: 1 %
EOS (ABSOLUTE): 0.3 10*3/uL (ref 0.0–0.4)
Eos: 5 %
Hematocrit: 39.3 % (ref 34.0–46.6)
Hemoglobin: 13.3 g/dL (ref 11.1–15.9)
Immature Grans (Abs): 0 10*3/uL (ref 0.0–0.1)
Immature Granulocytes: 0 %
Lymphocytes Absolute: 2.5 10*3/uL (ref 0.7–3.1)
Lymphs: 42 %
MCH: 30.5 pg (ref 26.6–33.0)
MCHC: 33.8 g/dL (ref 31.5–35.7)
MCV: 90 fL (ref 79–97)
Monocytes Absolute: 0.5 10*3/uL (ref 0.1–0.9)
Monocytes: 8 %
Neutrophils Absolute: 2.7 10*3/uL (ref 1.4–7.0)
Neutrophils: 44 %
Platelets: 287 10*3/uL (ref 150–450)
RBC: 4.36 x10E6/uL (ref 3.77–5.28)
RDW: 14.3 % (ref 11.7–15.4)
WBC: 6 10*3/uL (ref 3.4–10.8)

## 2023-06-20 LAB — CMP14+EGFR
ALT: 12 IU/L (ref 0–32)
AST: 17 IU/L (ref 0–40)
Albumin: 4.5 g/dL (ref 3.8–4.9)
Alkaline Phosphatase: 79 IU/L (ref 44–121)
BUN/Creatinine Ratio: 20 (ref 9–23)
BUN: 13 mg/dL (ref 6–24)
Bilirubin Total: 0.2 mg/dL (ref 0.0–1.2)
CO2: 21 mmol/L (ref 20–29)
Calcium: 9.3 mg/dL (ref 8.7–10.2)
Chloride: 104 mmol/L (ref 96–106)
Creatinine, Ser: 0.66 mg/dL (ref 0.57–1.00)
Globulin, Total: 2.5 g/dL (ref 1.5–4.5)
Glucose: 98 mg/dL (ref 70–99)
Potassium: 4.1 mmol/L (ref 3.5–5.2)
Sodium: 140 mmol/L (ref 134–144)
Total Protein: 7 g/dL (ref 6.0–8.5)
eGFR: 106 mL/min/{1.73_m2} (ref >59)

## 2023-06-20 LAB — HEMOGLOBIN A1C
Est. average glucose Bld gHb Est-mCnc: 123 mg/dL
Hgb A1c MFr Bld: 5.9 % — ABNORMAL HIGH (ref 4.8–5.6)

## 2023-06-22 ENCOUNTER — Other Ambulatory Visit: Payer: Self-pay

## 2023-06-22 DIAGNOSIS — I10 Essential (primary) hypertension: Secondary | ICD-10-CM

## 2023-06-22 MED ORDER — AMLODIPINE BESYLATE 5 MG PO TABS
5.0000 mg | ORAL_TABLET | Freq: Every day | ORAL | 1 refills | Status: DC
Start: 2023-06-22 — End: 2024-02-22

## 2023-07-17 ENCOUNTER — Encounter: Payer: Self-pay | Admitting: Nurse Practitioner

## 2023-07-17 ENCOUNTER — Ambulatory Visit: Attending: Nurse Practitioner | Admitting: Nurse Practitioner

## 2023-07-17 VITALS — BP 125/87 | HR 83 | Resp 19 | Ht 60.5 in | Wt 161.6 lb

## 2023-07-17 DIAGNOSIS — I10 Essential (primary) hypertension: Secondary | ICD-10-CM | POA: Diagnosis not present

## 2023-07-17 NOTE — Progress Notes (Signed)
 Assessment & Plan:  [name] was seen today for blood pressure check.  Diagnoses and all orders for this visit:  Essential hypertension Blood pressure is well controlled. Continue amlodipine and Diovan HCT Reminded to bring in blood pressure log for follow  up appointment.  RECOMMENDATIONS: DASH/Mediterranean Diets are healthier choices for HTN.     Patient has been counseled on age-appropriate routine health concerns for screening and prevention. These are reviewed and up-to-date. Referrals have been placed accordingly. Immunizations are up-to-date or declined.    Subjective:   Chief Complaint  Patient presents with   Blood Pressure Check    [name] 53 y.o. female presents to office today for follow up to elevated blood pressure.    HTN At her last visit with me 4 weeks ago we changed her valsartan 80 mg to DIOVAN HCT 80-12.5 mg daily and she was instructed to continue amlodipine 10 mg. At that time her blood pressure was significantly elevated. She has returned today and blood pressure is at goal. She also has a log with her today. Blood pressure readings are trending down based on log review.  BP Readings from Last 3 Encounters:  07/17/23 125/87  06/19/23 (!) 151/100  02/17/23 (!) 148/98      Review of Systems  Constitutional:  Negative for fever, malaise/fatigue and weight loss.  HENT: Negative.  Negative for nosebleeds.   Eyes: Negative.  Negative for blurred vision, double vision and photophobia.  Respiratory: Negative.  Negative for cough and shortness of breath.   Cardiovascular: Negative.  Negative for chest pain, palpitations and leg swelling.  Gastrointestinal: Negative.  Negative for heartburn, nausea and vomiting.  Musculoskeletal: Negative.  Negative for myalgias.  Neurological: Negative.  Negative for dizziness, focal weakness, seizures and headaches.  Psychiatric/Behavioral: Negative.  Negative for suicidal ideas.     Past Medical History:   Diagnosis Date   Asthma 1990   uses inhalers PRN   Hypertension 2005   on meds   Seizures (HCC)    childhood-"my mother said I just grew out of them and have not had any more since I was a yound girl"    Past Surgical History:  Procedure Laterality Date   CHOLECYSTECTOMY     EYE SURGERY Left    TUBAL LIGATION      Family History  Problem Relation Age of Onset   Hypertension Mother    Heart disease Father    Diabetes Maternal Grandfather    Colon polyps Neg Hx    Colon cancer Neg Hx    Esophageal cancer Neg Hx    Rectal cancer Neg Hx    Stomach cancer Neg Hx     Social History Reviewed with no changes to be made today.   Outpatient Medications Prior to Visit  Medication Sig Dispense Refill   albuterol (PROAIR HFA) 108 (90 Base) MCG/ACT inhaler Inhale 2 puffs into the lungs every 6 (six) hours as needed for wheezing or shortness of breath. 8.5 g 2   amLODipine (NORVASC) 5 MG tablet Take 1 tablet (5 mg total) by mouth daily. Please fill as a 90 day supply 90 tablet 1   Blood Pressure Monitor DEVI Please provide patient with insurance approved blood pressure monitor 1 Device 0   triamcinolone ointment (KENALOG) 0.5 % Apply 1 Application topically 2 (two) times daily. To back 60 g 3   valsartan-hydrochlorothiazide (DIOVAN-HCT) 80-12.5 MG tablet Take 1 tablet by mouth daily. 90 tablet 3   No facility-administered medications prior  to visit.    No Known Allergies     Objective:    BP 125/87 (BP Location: Left Arm, Patient Position: Sitting, Cuff Size: Normal)   Pulse 83   Resp 19   Ht 5' 0.5" (1.537 m)   Wt 161 lb 9.6 oz (73.3 kg)   SpO2 99%   BMI 31.04 kg/m  Wt Readings from Last 3 Encounters:  07/17/23 161 lb 9.6 oz (73.3 kg)  06/19/23 162 lb (73.5 kg)  02/17/23 161 lb (73 kg)    Physical Exam Vitals and nursing note reviewed.  Constitutional:      Appearance: She is well-developed.  HENT:     Head: Normocephalic and atraumatic.  Cardiovascular:      Rate and Rhythm: Normal rate and regular rhythm.     Heart sounds: Normal heart sounds. No murmur heard.    No friction rub. No gallop.  Pulmonary:     Effort: Pulmonary effort is normal. No tachypnea or respiratory distress.     Breath sounds: Normal breath sounds. No decreased breath sounds, wheezing, rhonchi or rales.  Chest:     Chest wall: No tenderness.  Abdominal:     General: Bowel sounds are normal.     Palpations: Abdomen is soft.  Musculoskeletal:        General: Normal range of motion.     Cervical back: Normal range of motion.  Skin:    General: Skin is warm and dry.  Neurological:     Mental Status: She is alert and oriented to person, place, and time.     Coordination: Coordination normal.  Psychiatric:        Behavior: Behavior normal. Behavior is cooperative.        Thought Content: Thought content normal.        Judgment: Judgment normal.          Patient has been counseled extensively about nutrition and exercise as well as the importance of adherence with medications and regular follow-up. The patient was given clear instructions to go to ER or return to medical center if symptoms don't improve, worsen or new problems develop. The patient verbalized understanding.   Follow-up: Return in about 3 months (around 10/16/2023).   Collins Dean, FNP-BC [facility] [place], [place] [phone]   07/17/2023, 1:56 PM

## 2023-08-05 ENCOUNTER — Other Ambulatory Visit: Payer: Self-pay | Admitting: Family Medicine

## 2023-08-05 DIAGNOSIS — I10 Essential (primary) hypertension: Secondary | ICD-10-CM

## 2023-09-29 ENCOUNTER — Other Ambulatory Visit: Payer: Self-pay

## 2023-09-29 ENCOUNTER — Encounter (HOSPITAL_COMMUNITY): Payer: Self-pay

## 2023-09-29 ENCOUNTER — Emergency Department (HOSPITAL_COMMUNITY): Admission: EM | Admit: 2023-09-29 | Discharge: 2023-09-29 | Disposition: A | Discharge status: Home / Self Care

## 2023-09-29 DIAGNOSIS — Y92410 Unspecified street and highway as the place of occurrence of the external cause: Secondary | ICD-10-CM | POA: Diagnosis not present

## 2023-09-29 DIAGNOSIS — R0789 Other chest pain: Secondary | ICD-10-CM | POA: Diagnosis present

## 2023-09-29 MED ORDER — CYCLOBENZAPRINE HCL 10 MG PO TABS: 10.0000 mg | ORAL_TABLET | Freq: Two times a day (BID) | ORAL | 0 refills | Status: DC | PRN

## 2023-09-29 MED ORDER — IBUPROFEN 600 MG PO TABS: 600.0000 mg | ORAL_TABLET | Freq: Four times a day (QID) | ORAL | 0 refills | Status: DC | PRN
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
## 2023-10-13 ENCOUNTER — Telehealth: Payer: Self-pay | Admitting: Nurse Practitioner

## 2023-10-13 NOTE — Telephone Encounter (Signed)
 CXCalled pt to confirm appt. Pt did not answer and LVM.

## 2023-10-16 ENCOUNTER — Ambulatory Visit: Attending: Nurse Practitioner | Admitting: Nurse Practitioner

## 2023-10-16 ENCOUNTER — Encounter: Payer: Self-pay | Admitting: Nurse Practitioner

## 2023-10-16 VITALS — BP 123/87 | HR 83 | Resp 19 | Ht 60.0 in | Wt 164.6 lb

## 2023-10-16 DIAGNOSIS — I10 Essential (primary) hypertension: Secondary | ICD-10-CM | POA: Diagnosis not present

## 2023-10-16 DIAGNOSIS — Z1231 Encounter for screening mammogram for malignant neoplasm of breast: Secondary | ICD-10-CM | POA: Diagnosis not present

## 2023-10-16 DIAGNOSIS — R232 Flushing: Secondary | ICD-10-CM

## 2023-10-16 NOTE — Progress Notes (Signed)
 Assessment & Plan:  [name] was seen today for hypertension.  Diagnoses and all orders for this visit:  Primary hypertension Blood pressure normalized during visit. Managed with medication through [facility]. - Continue current antihypertensive regimen: Amlodipine  and Diovan  HCT.   Breast cancer screening by mammogram -     MM 3D SCREENING MAMMOGRAM BILATERAL BREAST; Future   Hot Flashes Intermittent nocturnal hot flashes managed with non-pharmacological methods. Symptoms manageable. Discussed black cohosh and estrogen, but she prefers non-pharmacological management. Pharmacological treatment may be necessary if symptoms worsen.   General Health Maintenance Mammogram due in August. - Referral placed   Patient has been counseled on age-appropriate routine health concerns for screening and prevention. These are reviewed and up-to-date. Referrals have been placed accordingly. Immunizations are up-to-date or declined.    Subjective:   Chief Complaint  Patient presents with   Hypertension    [name] 53 y.o. female presents to office today for follow up to HTN  She has a past medical history of Asthma (1990), Hypertension (2005), and Seizures   HTN Blood pressure is well-controlled. BP Readings from Last 3 Encounters:  10/16/23 123/87  09/29/23 (!) 139/106  07/17/23 125/87    Vasomotor symptoms (hot flashes) - Hot flashes primarily occur at night and are not constant - Symptoms are particularly bothersome at night, described as 'killing me' - Symptom management includes use of a fan, a small battery-operated fan on the bed, and an ice pack, which provides partial relief - Drinks water and juice throughout the day for symptom relief, with limited benefit - No current use of pharmacological therapy for hot flashes   Review of Systems  Constitutional:  Negative for fever, malaise/fatigue and weight loss.       Hot flashes  HENT: Negative.  Negative  for nosebleeds.   Eyes: Negative.  Negative for blurred vision, double vision and photophobia.  Respiratory: Negative.  Negative for cough and shortness of breath.   Cardiovascular: Negative.  Negative for chest pain, palpitations and leg swelling.  Gastrointestinal: Negative.  Negative for heartburn, nausea and vomiting.  Musculoskeletal: Negative.  Negative for myalgias.  Neurological: Negative.  Negative for dizziness, focal weakness, seizures and headaches.  Psychiatric/Behavioral: Negative.  Negative for suicidal ideas.     Past Medical History:  Diagnosis Date   Asthma 1990   uses inhalers PRN   Hypertension 2005   on meds   Seizures (HCC)    childhood-my mother said I just grew out of them and have not had any more since I was a yound girl    Past Surgical History:  Procedure Laterality Date   CHOLECYSTECTOMY     EYE SURGERY Left    TUBAL LIGATION      Family History  Problem Relation Age of Onset   Hypertension Mother    Heart disease Father    Diabetes Maternal Grandfather    Colon polyps Neg Hx    Colon cancer Neg Hx    Esophageal cancer Neg Hx    Rectal cancer Neg Hx    Stomach cancer Neg Hx     Social History Reviewed with no changes to be made today.   Outpatient Medications Prior to Visit  Medication Sig Dispense Refill   albuterol  (PROAIR  HFA) 108 (90 Base) MCG/ACT inhaler Inhale 2 puffs into the lungs every 6 (six) hours as needed for wheezing or shortness of breath. 8.5 g 2   amLODipine  (NORVASC ) 5 MG tablet Take 1 tablet (5 mg total)  by mouth daily. Please fill as a 90 day supply 90 tablet 1   Blood Pressure Monitor DEVI Please provide patient with insurance approved blood pressure monitor 1 Device 0   cyclobenzaprine  (FLEXERIL ) 10 MG tablet Take 1 tablet (10 mg total) by mouth 2 (two) times daily as needed for muscle spasms. 20 tablet 0   ibuprofen  (ADVIL ) 600 MG tablet Take 1 tablet (600 mg total) by mouth every 6 (six) hours as needed. 30 tablet 0    triamcinolone  ointment (KENALOG ) 0.5 % Apply 1 Application topically 2 (two) times daily. To back 60 g 3   valsartan -hydrochlorothiazide  (DIOVAN -HCT) 80-12.5 MG tablet Take 1 tablet by mouth daily. 90 tablet 3   No facility-administered medications prior to visit.    No Known Allergies     Objective:    BP 123/87 (BP Location: Left Arm, Patient Position: Sitting, Cuff Size: Normal)   Pulse 83   Resp 19   Ht 5' (1.524 m)   Wt 164 lb 9.6 oz (74.7 kg)   LMP 09/04/2023 (Approximate)   SpO2 100%   BMI 32.15 kg/m  Wt Readings from Last 3 Encounters:  10/16/23 164 lb 9.6 oz (74.7 kg)  07/17/23 161 lb 9.6 oz (73.3 kg)  06/19/23 162 lb (73.5 kg)    Physical Exam Vitals and nursing note reviewed.  Constitutional:      Appearance: She is well-developed.  HENT:     Head: Normocephalic and atraumatic.  Cardiovascular:     Rate and Rhythm: Normal rate and regular rhythm.     Heart sounds: Normal heart sounds. No murmur heard.    No friction rub. No gallop.  Pulmonary:     Effort: Pulmonary effort is normal. No tachypnea or respiratory distress.     Breath sounds: Normal breath sounds. No decreased breath sounds, wheezing, rhonchi or rales.  Chest:     Chest wall: No tenderness.  Abdominal:     General: Bowel sounds are normal.     Palpations: Abdomen is soft.  Musculoskeletal:        General: Normal range of motion.     Cervical back: Normal range of motion.  Skin:    General: Skin is warm and dry.  Neurological:     Mental Status: She is alert and oriented to person, place, and time.     Coordination: Coordination normal.  Psychiatric:        Behavior: Behavior normal. Behavior is cooperative.        Thought Content: Thought content normal.        Judgment: Judgment normal.          Patient has been counseled extensively about nutrition and exercise as well as the importance of adherence with medications and regular follow-up. The patient was given clear  instructions to go to ER or return to medical center if symptoms don't improve, worsen or new problems develop. The patient verbalized understanding.   Follow-up: Return in about 3 months (around 01/19/2024).    Discussed the use of AI scribe software for clinical note transcription with the patient, who gave verbal consent to proceed.      Haze LELON Servant, FNP-BC [facility] [place], [place] [phone]   10/16/2023, 1:51 PM

## 2023-11-13 ENCOUNTER — Other Ambulatory Visit (HOSPITAL_COMMUNITY): Payer: Self-pay

## 2023-11-24 ENCOUNTER — Ambulatory Visit
Admission: RE | Admit: 2023-11-24 | Discharge: 2023-11-24 | Disposition: A | Discharge status: Home / Self Care | Source: Ambulatory Visit | Attending: Nurse Practitioner | Admitting: Nurse Practitioner

## 2023-11-24 DIAGNOSIS — Z1231 Encounter for screening mammogram for malignant neoplasm of breast: Secondary | ICD-10-CM

## 2023-11-28 ENCOUNTER — Ambulatory Visit: Payer: Self-pay | Admitting: Nurse Practitioner

## 2024-01-19 ENCOUNTER — Ambulatory Visit: Attending: Nurse Practitioner | Admitting: Nurse Practitioner

## 2024-01-19 ENCOUNTER — Encounter: Payer: Self-pay | Admitting: Nurse Practitioner

## 2024-01-19 VITALS — BP 140/91 | HR 80 | Resp 18 | Ht 60.0 in | Wt 171.6 lb

## 2024-01-19 DIAGNOSIS — E7800 Pure hypercholesterolemia, unspecified: Secondary | ICD-10-CM

## 2024-01-19 DIAGNOSIS — R7989 Other specified abnormal findings of blood chemistry: Secondary | ICD-10-CM | POA: Diagnosis not present

## 2024-01-19 DIAGNOSIS — I10 Essential (primary) hypertension: Secondary | ICD-10-CM

## 2024-01-19 DIAGNOSIS — R7303 Prediabetes: Secondary | ICD-10-CM

## 2024-01-19 NOTE — Progress Notes (Signed)
 Assessment & Plan:  [name] was seen today for hypertension.  Diagnoses and all orders for this visit:  Primary hypertension -     CMP14+EGFR Caffeinated coffee may elevate blood pressure. - Advised to avoid caffeinated beverages. Continue hyzaar and amlodipine  as prescribed.  Reminded to bring in blood pressure log for follow  up appointment.  RECOMMENDATIONS: DASH/Mediterranean Diets are healthier choices for HTN.    Hypercholesterolemia -     Lipid panel INSTRUCTIONS: Work on a low fat, heart healthy diet and participate in regular aerobic exercise program by working out at least 150 minutes per week; 5 days a week-30 minutes per day. Avoid red meat/beef/steak,  fried foods. junk foods, sodas, sugary drinks, unhealthy snacking, alcohol and smoking.  Drink at least 80 oz of water per day and monitor your carbohydrate intake daily.    Prediabetes -     Hemoglobin A1c    Patient has been counseled on age-appropriate routine health concerns for screening and prevention. These are reviewed and up-to-date. Referrals have been placed accordingly. Immunizations are up-to-date or declined.    Subjective:   Chief Complaint  Patient presents with   Hypertension   History of Present Illness [name] is a 53 year old female with hypertension who presents for a blood pressure check.  She has a past medical history of Asthma (1990), Hypertension (2005), and Seizures   HTN Blood pressure is slightly elevated today. . She takes her blood pressure medications in the morning. She is concerned about the potential impact of drinking caffeinated coffee on her blood pressure. She usually does not consume coffee, but her husband buys it, and she is unsure if it contains caffeine.  BP Readings from Last 3 Encounters:  01/19/24 (!) 140/91  10/16/23 123/87  09/29/23 (!) 139/106    Prediabetes Diet controlled.  Lab Results  Component Value Date   HGBA1C 5.8 (H) 01/19/2024     Review of Systems  Constitutional:  Negative for fever, malaise/fatigue and weight loss.  HENT: Negative.  Negative for nosebleeds.   Eyes: Negative.  Negative for blurred vision, double vision and photophobia.  Respiratory: Negative.  Negative for cough and shortness of breath.   Cardiovascular: Negative.  Negative for chest pain, palpitations and leg swelling.  Gastrointestinal: Negative.  Negative for heartburn, nausea and vomiting.  Musculoskeletal: Negative.  Negative for myalgias.  Neurological: Negative.  Negative for dizziness, focal weakness, seizures and headaches.  Psychiatric/Behavioral: Negative.  Negative for suicidal ideas.     Past Medical History:  Diagnosis Date   Asthma 1990   uses inhalers PRN   Hypertension 2005   on meds   Seizures (HCC)    childhood-my mother said I just grew out of them and have not had any more since I was a yound girl    Past Surgical History:  Procedure Laterality Date   CHOLECYSTECTOMY     EYE SURGERY Left    TUBAL LIGATION      Family History  Problem Relation Age of Onset   Hypertension Mother    Heart disease Father    Diabetes Maternal Grandfather    Colon polyps Neg Hx    Colon cancer Neg Hx    Esophageal cancer Neg Hx    Rectal cancer Neg Hx    Stomach cancer Neg Hx     Social History Reviewed with no changes to be made today.   Outpatient Medications Prior to Visit  Medication Sig Dispense Refill   amLODipine  (NORVASC )  5 MG tablet Take 1 tablet (5 mg total) by mouth daily. Please fill as a 90 day supply 90 tablet 1   Blood Pressure Monitor DEVI Please provide patient with insurance approved blood pressure monitor 1 Device 0   valsartan -hydrochlorothiazide  (DIOVAN -HCT) 80-12.5 MG tablet Take 1 tablet by mouth daily. 90 tablet 3   albuterol  (PROAIR  HFA) 108 (90 Base) MCG/ACT inhaler Inhale 2 puffs into the lungs every 6 (six) hours as needed for wheezing or shortness of breath. (Patient not taking: Reported on  01/19/2024) 8.5 g 2   cyclobenzaprine  (FLEXERIL ) 10 MG tablet Take 1 tablet (10 mg total) by mouth 2 (two) times daily as needed for muscle spasms. (Patient not taking: Reported on 01/19/2024) 20 tablet 0   ibuprofen  (ADVIL ) 600 MG tablet Take 1 tablet (600 mg total) by mouth every 6 (six) hours as needed. (Patient not taking: Reported on 01/19/2024) 30 tablet 0   triamcinolone  ointment (KENALOG ) 0.5 % Apply 1 Application topically 2 (two) times daily. To back (Patient not taking: Reported on 01/19/2024) 60 g 3   No facility-administered medications prior to visit.    No Known Allergies     Objective:    BP (!) 140/91 (BP Location: Left Arm, Patient Position: Sitting, Cuff Size: Normal)   Pulse 80   Resp 18   Ht 5' (1.524 m)   Wt 171 lb 9.6 oz (77.8 kg)   SpO2 98%   BMI 33.51 kg/m  Wt Readings from Last 3 Encounters:  01/19/24 171 lb 9.6 oz (77.8 kg)  10/16/23 164 lb 9.6 oz (74.7 kg)  07/17/23 161 lb 9.6 oz (73.3 kg)    Physical Exam Vitals and nursing note reviewed.  Constitutional:      Appearance: She is well-developed.  HENT:     Head: Normocephalic and atraumatic.  Cardiovascular:     Rate and Rhythm: Normal rate and regular rhythm.     Heart sounds: Normal heart sounds. No murmur heard.    No friction rub. No gallop.  Pulmonary:     Effort: Pulmonary effort is normal. No tachypnea or respiratory distress.     Breath sounds: Normal breath sounds. No decreased breath sounds, wheezing, rhonchi or rales.  Chest:     Chest wall: No tenderness.  Musculoskeletal:        General: Normal range of motion.     Cervical back: Normal range of motion.  Skin:    General: Skin is warm and dry.  Neurological:     Mental Status: She is alert and oriented to person, place, and time.     Coordination: Coordination normal.  Psychiatric:        Behavior: Behavior normal. Behavior is cooperative.        Thought Content: Thought content normal.        Judgment: Judgment normal.           Patient has been counseled extensively about nutrition and exercise as well as the importance of adherence with medications and regular follow-up. The patient was given clear instructions to go to ER or return to medical center if symptoms don't improve, worsen or new problems develop. The patient verbalized understanding.   Follow-up: Return in about 15 weeks (around 05/03/2024).   Haze LELON Servant, FNP-BC [facility] [place], [place] [phone]   02/04/2024, 10:29 AM

## 2024-01-20 LAB — CMP14+EGFR
ALT: 18 IU/L (ref 0–32)
AST: 18 IU/L (ref 0–40)
Albumin: 4.6 g/dL (ref 3.8–4.9)
Alkaline Phosphatase: 81 IU/L (ref 49–135)
BUN/Creatinine Ratio: 17 (ref 9–23)
BUN: 14 mg/dL (ref 6–24)
Bilirubin Total: 0.3 mg/dL (ref 0.0–1.2)
CO2: 26 mmol/L (ref 20–29)
Calcium: 9.6 mg/dL (ref 8.7–10.2)
Chloride: 103 mmol/L (ref 96–106)
Creatinine, Ser: 0.83 mg/dL (ref 0.57–1.00)
Globulin, Total: 2.7 g/dL (ref 1.5–4.5)
Glucose: 93 mg/dL (ref 70–99)
Potassium: 4 mmol/L (ref 3.5–5.2)
Sodium: 144 mmol/L (ref 134–144)
Total Protein: 7.3 g/dL (ref 6.0–8.5)
eGFR: 85 mL/min/1.73 (ref >59)

## 2024-01-20 LAB — LIPID PANEL
Chol/HDL Ratio: 3.3 ratio (ref 0.0–4.4)
Cholesterol, Total: 196 mg/dL (ref 100–199)
HDL: 59 mg/dL (ref >39)
LDL Chol Calc (NIH): 121 mg/dL — ABNORMAL HIGH (ref 0–99)
Triglycerides: 86 mg/dL (ref 0–149)
VLDL Cholesterol Cal: 16 mg/dL (ref 5–40)

## 2024-01-20 LAB — HEMOGLOBIN A1C
Est. average glucose Bld gHb Est-mCnc: 120 mg/dL
Hgb A1c MFr Bld: 5.8 % — ABNORMAL HIGH (ref 4.8–5.6)

## 2024-01-21 ENCOUNTER — Ambulatory Visit: Payer: Self-pay | Admitting: Nurse Practitioner

## 2024-02-21 ENCOUNTER — Other Ambulatory Visit: Payer: Self-pay | Admitting: Nurse Practitioner

## 2024-02-21 DIAGNOSIS — I10 Essential (primary) hypertension: Secondary | ICD-10-CM
